# Patient Record
Sex: Female | Born: 1985 | Race: Black or African American | Hispanic: No | Marital: Single | Smoking: Current every day smoker
Health system: Southern US, Community
[De-identification: ages and names within clinical notes are randomized; demographics above are authoritative.]

## PROBLEM LIST (undated history)

## (undated) DIAGNOSIS — F410 Panic disorder [episodic paroxysmal anxiety] without agoraphobia: Secondary | ICD-10-CM

## (undated) DIAGNOSIS — D573 Sickle-cell trait: Secondary | ICD-10-CM

## (undated) DIAGNOSIS — F32A Depression, unspecified: Secondary | ICD-10-CM

## (undated) DIAGNOSIS — F329 Major depressive disorder, single episode, unspecified: Secondary | ICD-10-CM

---

## 2013-12-28 ENCOUNTER — Emergency Department (HOSPITAL_COMMUNITY)
Admission: EM | Admit: 2013-12-28 | Discharge: 2013-12-28 | Disposition: A | Discharge status: Home / Self Care | Payer: Self-pay | Attending: Emergency Medicine | Admitting: Emergency Medicine

## 2013-12-28 ENCOUNTER — Encounter (HOSPITAL_COMMUNITY): Payer: Self-pay | Admitting: Emergency Medicine

## 2013-12-28 DIAGNOSIS — F17200 Nicotine dependence, unspecified, uncomplicated: Secondary | ICD-10-CM | POA: Insufficient documentation

## 2013-12-28 DIAGNOSIS — F3289 Other specified depressive episodes: Secondary | ICD-10-CM | POA: Insufficient documentation

## 2013-12-28 DIAGNOSIS — R109 Unspecified abdominal pain: Secondary | ICD-10-CM | POA: Insufficient documentation

## 2013-12-28 DIAGNOSIS — I498 Other specified cardiac arrhythmias: Secondary | ICD-10-CM | POA: Insufficient documentation

## 2013-12-28 DIAGNOSIS — M2550 Pain in unspecified joint: Secondary | ICD-10-CM | POA: Insufficient documentation

## 2013-12-28 DIAGNOSIS — Z79899 Other long term (current) drug therapy: Secondary | ICD-10-CM | POA: Insufficient documentation

## 2013-12-28 DIAGNOSIS — F410 Panic disorder [episodic paroxysmal anxiety] without agoraphobia: Secondary | ICD-10-CM | POA: Insufficient documentation

## 2013-12-28 DIAGNOSIS — R112 Nausea with vomiting, unspecified: Secondary | ICD-10-CM | POA: Insufficient documentation

## 2013-12-28 DIAGNOSIS — R1110 Vomiting, unspecified: Secondary | ICD-10-CM

## 2013-12-28 DIAGNOSIS — R51 Headache: Secondary | ICD-10-CM | POA: Insufficient documentation

## 2013-12-28 DIAGNOSIS — R52 Pain, unspecified: Secondary | ICD-10-CM | POA: Insufficient documentation

## 2013-12-28 DIAGNOSIS — Z3202 Encounter for pregnancy test, result negative: Secondary | ICD-10-CM | POA: Insufficient documentation

## 2013-12-28 DIAGNOSIS — Z862 Personal history of diseases of the blood and blood-forming organs and certain disorders involving the immune mechanism: Secondary | ICD-10-CM | POA: Insufficient documentation

## 2013-12-28 DIAGNOSIS — F329 Major depressive disorder, single episode, unspecified: Secondary | ICD-10-CM | POA: Insufficient documentation

## 2013-12-28 DIAGNOSIS — R519 Headache, unspecified: Secondary | ICD-10-CM

## 2013-12-28 HISTORY — DX: Major depressive disorder, single episode, unspecified: F32.9

## 2013-12-28 HISTORY — DX: Depression, unspecified: F32.A

## 2013-12-28 HISTORY — DX: Sickle-cell trait: D57.3

## 2013-12-28 HISTORY — DX: Panic disorder (episodic paroxysmal anxiety): F41.0

## 2013-12-28 LAB — CBC WITH DIFFERENTIAL/PLATELET
Basophils Absolute: 0 10*3/uL (ref 0.0–0.1)
Basophils Relative: 0 % (ref 0–1)
EOS ABS: 0.1 10*3/uL (ref 0.0–0.7)
EOS PCT: 1 % (ref 0–5)
HCT: 36 % (ref 36.0–46.0)
HEMOGLOBIN: 13.1 g/dL (ref 12.0–15.0)
LYMPHS PCT: 24 % (ref 12–46)
Lymphs Abs: 2.2 10*3/uL (ref 0.7–4.0)
MCH: 29.2 pg (ref 26.0–34.0)
MCHC: 36.4 g/dL — ABNORMAL HIGH (ref 30.0–36.0)
MCV: 80.2 fL (ref 78.0–100.0)
MONOS PCT: 9 % (ref 3–12)
Monocytes Absolute: 0.9 10*3/uL (ref 0.1–1.0)
Neutro Abs: 6.1 10*3/uL (ref 1.7–7.7)
Neutrophils Relative %: 66 % (ref 43–77)
Platelets: 216 10*3/uL (ref 150–400)
RBC: 4.49 MIL/uL (ref 3.87–5.11)
RDW: 13.7 % (ref 11.5–15.5)
WBC: 9.2 10*3/uL (ref 4.0–10.5)

## 2013-12-28 LAB — COMPREHENSIVE METABOLIC PANEL
ALK PHOS: 61 U/L (ref 39–117)
ALT: 7 U/L (ref 0–35)
AST: 14 U/L (ref 0–37)
Albumin: 4 g/dL (ref 3.5–5.2)
BUN: 11 mg/dL (ref 6–23)
CALCIUM: 9.4 mg/dL (ref 8.4–10.5)
CO2: 25 mEq/L (ref 19–32)
Chloride: 103 mEq/L (ref 96–112)
Creatinine, Ser: 0.74 mg/dL (ref 0.50–1.10)
GFR calc non Af Amer: >90 mL/min (ref >90)
Glucose, Bld: 95 mg/dL (ref 70–99)
POTASSIUM: 4 meq/L (ref 3.7–5.3)
Sodium: 141 mEq/L (ref 137–147)
TOTAL PROTEIN: 7.2 g/dL (ref 6.0–8.3)
Total Bilirubin: 0.5 mg/dL (ref 0.3–1.2)

## 2013-12-28 LAB — URINALYSIS, ROUTINE W REFLEX MICROSCOPIC
Glucose, UA: NEGATIVE mg/dL
HGB URINE DIPSTICK: NEGATIVE
Ketones, ur: 15 mg/dL — AB
Leukocytes, UA: NEGATIVE
NITRITE: NEGATIVE
PROTEIN: NEGATIVE mg/dL
SPECIFIC GRAVITY, URINE: 1.029 (ref 1.005–1.030)
Urobilinogen, UA: 1 mg/dL (ref 0.0–1.0)
pH: 6.5 (ref 5.0–8.0)

## 2013-12-28 LAB — POC URINE PREG, ED: Preg Test, Ur: NEGATIVE

## 2013-12-28 MED ORDER — DIPHENHYDRAMINE HCL 25 MG PO CAPS: 25.0000 mg | ORAL_CAPSULE | Freq: Once | ORAL | Status: DC

## 2013-12-28 MED ORDER — DEXAMETHASONE SODIUM PHOSPHATE 10 MG/ML IJ SOLN: 10.0000 mg | Freq: Once | INTRAMUSCULAR | Status: DC

## 2013-12-28 MED ORDER — METOCLOPRAMIDE HCL 5 MG/ML IJ SOLN: 10.0000 mg | Freq: Once | INTRAMUSCULAR | Status: DC

## 2013-12-28 MED ORDER — IBUPROFEN 600 MG PO TABS: 600.0000 mg | ORAL_TABLET | Freq: Four times a day (QID) | ORAL | Status: DC | PRN

## 2013-12-28 MED ORDER — KETOROLAC TROMETHAMINE 30 MG/ML IJ SOLN
30.0000 mg | Freq: Once | INTRAMUSCULAR | Status: AC
  Administered 2013-12-28: 30 mg via INTRAVENOUS
  Filled 2013-12-28: qty 1

## 2013-12-28 MED ORDER — ONDANSETRON 4 MG PO TBDP
4.0000 mg | ORAL_TABLET | Freq: Once | ORAL | Status: AC
  Administered 2013-12-28: 4 mg via ORAL
  Filled 2013-12-28: qty 1

## 2013-12-28 MED ORDER — IBUPROFEN 600 MG PO TABS: 600.0000 mg | ORAL_TABLET | Freq: Three times a day (TID) | ORAL | Status: DC | PRN

## 2013-12-28 MED ORDER — SODIUM CHLORIDE 0.9 % IV BOLUS (SEPSIS)
1000.0000 mL | Freq: Once | INTRAVENOUS | Status: AC
  Administered 2013-12-28: 1000 mL via INTRAVENOUS

## 2013-12-28 MED ORDER — DEXAMETHASONE SODIUM PHOSPHATE 10 MG/ML IJ SOLN
10.0000 mg | Freq: Once | INTRAMUSCULAR | Status: AC
  Administered 2013-12-28: 10 mg via INTRAVENOUS
  Filled 2013-12-28: qty 1

## 2013-12-28 NOTE — Discharge Instructions (Signed)
Call for a follow up appointment with a Family or Primary Care Provider.  °Return to the Emergency Department if Symptoms worsen.   °Take medication as prescribed.  ° ° °Emergency Department Resource Guide °1) Find a Doctor and Pay Out of Pocket °Although you won't have to find out who is covered by your insurance plan, it is a good idea to ask around and get recommendations. You will then need to call the office and see if the doctor you have chosen will accept you as a new patient and what types of options they offer for patients who are self-pay. Some doctors offer discounts or will set up payment plans for their patients who do not have insurance, but you will need to ask so you aren't surprised when you get to your appointment. ° °2) Contact Your Local Health Department °Not all health departments have doctors that can see patients for sick visits, but many do, so it is worth a call to see if yours does. If you don't know where your local health department is, you can check in your phone book. The CDC also has a tool to help you locate your state's health department, and many state websites also have listings of all of their local health departments. ° °3) Find a Walk-in Clinic °If your illness is not likely to be very severe or complicated, you may want to try a walk in clinic. These are popping up all over the country in pharmacies, drugstores, and shopping centers. They're usually staffed by nurse practitioners or physician assistants that have been trained to treat common illnesses and complaints. They're usually fairly quick and inexpensive. However, if you have serious medical issues or chronic medical problems, these are probably not your best option. ° °No Primary Care Doctor: °- Call [facility] at  832-8000 - they can help you locate a primary care doctor that  accepts your insurance, provides certain services, etc. °- Physician Referral Service- 1-800-533-3463 ° °Chronic Pain  Problems: °Organization         Address  Phone   Notes  °[facility]  (336) 297-2271 Patients need to be referred by their primary care doctor.  ° °Medication Assistance: °Organization         Address  Phone   Notes  °[place] [facility] [address] °[place], [place] [address] (336) 641-8030 --Must be a resident of [place] °-- Must have NO insurance coverage whatsoever (no Medicaid/ Medicare, etc.) °-- The pt. MUST have a primary care doctor that directs their care regularly and follows them in the community °  °[facility]  (866) 331-1348   °[employer]  (888) 892-1162   ° °Agencies that provide inexpensive medical care: °Organization         Address  Phone   Notes  °[facility]  (336) 832-8035   ° Internal Medicine    (336) 832-7272   °[facility] [address] °[place], [place] [address] (336) 832-4777   °[facility] [address], °[place] (336) 271-4999   °[facility]    (336) 373-0678   °[facility]    (336) 272-1050   °[facility] ° [address], [place] Phone:  (336) 832-4444, Fax:  (336) 832-4440 Hours of Operation:  9 am - 6 pm, M-F.  Also accepts Medicaid/Medicare and self-pay.  °[facility] ° [address], [place] Phone: (336) 832-3150, Fax: (336) 832-3151. Hours of Operation:  8:30 am - 5:30 pm, M-F.  Also accepts Medicaid and self-pay.  °[facility] [place] [address], [place] Phone: (336) 878-6027   °[facility] [address], [place], [place] (336)723-1848, Ext. 123 Mondays & Thursdays: 7-9 AM.  First 15 patients are seen on a first come, first serve basis. °  ° °Medicaid-accepting [place] Providers: ° °Organization         Address  Phone   Notes  °[facility] [address], [place] (336) 641-2100 Also  accepts self-pay patients.  °[facility] [address], [place] ° (336) 856-9996   °[facility] [address], [place] (336) 288-8857   °[facility] [address] [place] [address], [place] (336) 299-7000   °Veita Bland [address], [place]  ° (336) 373-1557 Only accepts [place] Access Medicaid patients after they have their name applied to their card.  ° °Self-Pay (no insurance) in [place]: ° °Organization         Address  Phone   Notes  °Sickle Cell Patients, [facility] [address], [place] (336) 832-1970   °[facility] [address], [place] (336) 832-4400   °[facility] ° [address], [place] (336) 992-4800   °[facility]/Dr. Osei-Bonsu ° [address] [place] [address], [place] or [address], [place] (336) 841-8500 Phone number for both [place] and [place] locations is the same.  °[facility] [address], [place] (336) 299-0000   °[facility] [address] [place] [address], [place] or [address] (336) 852-7530 °(336) 878-2260   °[facility] [address], [place] (336) 350-1642, phone; (336) 294-5005, fax Sees patients 1st and 3rd Saturday of every month.  Must not qualify for public or private insurance (i.e. Medicaid, Medicare, [place] Health Choice, Veterans' Benefits) • Household income should be no more than 200% of the poverty level •The clinic cannot treat you if you are pregnant or think you are pregnant • Sexually transmitted diseases are not treated at the clinic.  ° ° °Dental Care: °Organization         Address  Phone  Notes  °[facility] [facility] [address], [place] (336) 641-6152 Accepts children up to age 21 who are enrolled in Medicaid or [place] Health Choice; pregnant  women with a Medicaid card; and children who have applied for Medicaid or [place] Health Choice, but were declined, whose parents can pay a reduced fee at time of service.  °[facility] [place]  [address], [place] (336) 641-7733 Accepts children up to age 21 who are enrolled in Medicaid or [place] Health Choice; pregnant women with a Medicaid card; and children who have applied for Medicaid or  Health Choice, but were declined, whose parents can pay a reduced fee at time of service.  °[facility] ° [address], [place] (336) 641-4533 Patients are seen by appointment only. Walk-ins are not accepted. [facility] will see patients 18 years of age and older. °Monday - Tuesday (8am-5pm) °Most Wednesdays (8:30-5pm) °$30 per visit, cash only  °[facility] °   [address], [place] (336) 641-4533 Patients are seen by appointment only. Walk-ins are not accepted. [facility] will see patients 18 years of age and older. °One Wednesday Evening (Monthly: Volunteer Based).  $30 per visit, cash only  °[facility]  (919) 537-3737 for adults; Children under age 4, call Graduate Pediatric Dentistry at (919) [phone]. Children aged 4-14, please call (919) [phone] to request a pediatric application. ° Dental services are provided in all areas of dental care including fillings, crowns and bridges, complete and partial dentures, implants, gum treatment, root canals, and extractions. Preventive care is also provided. Treatment is provided to both adults and children. °Patients are selected via a lottery and there is often a waiting list. °  °[facility] [address], °[place] ° (336) 763-8833 [email or web address] °  °[facility] [address], [place], [place] (336)723-1848, Ext. 123 Second and Fourth Thursday of each month, opens at 6:30 AM; Clinic ends at 9 AM.  Patients are  seen on a first-come first-served basis, and a limited number are seen during each clinic.  ° °[facility] ° [address], [place], [place] (336) 723-7904   Eligibility Requirements °You must have lived in [place], [place], or [place] counties for at least the last three months. °  You cannot be eligible for state or federal sponsored healthcare insurance, including Veterans Administration, Medicaid, or Medicare. °  You generally cannot be eligible for healthcare insurance through your employer.  °  How to apply: °Eligibility screenings are held every Tuesday and Wednesday afternoon from 1:00 pm until 4:00 pm. You do not need an appointment for the interview!  °[facility] [address], [place], [place] 336-631-2330   °[facility]  336-342-8273   °[facility]  336-703-3100   °[facility]  336-570-6415   ° °Behavioral Health Resources in the Community: °Intensive Outpatient Programs °Organization         Address  Phone  Notes  °[facility] [address], [place], [place] 336-878-6098   °[facility] [address], [place], [place] 336-832-9800   °[facility] [address], [place], [place] ° 336-882-2125   °[facility] [address],  °[place], [place] 1-800-853-5163 or 336-641-4981   °Substance Abuse Resources °Organization         Address  Phone  Notes  °[facility]  336-882-2125   °[facility]  336-784-9470   °[facility]  336-285-9073   °[facility]  336-845-3988   °Residential & Outpatient Substance Abuse Program  1-800-659-3381   °Psychological Services °Organization         Address  Phone  Notes  °[facility]  336- 832-9600   °[facility]  336- 378-7881   °[facility] [address], [place] 1-800-853-5163 or 336-641-4981   ° °Mobile Crisis  Teams °Organization         Address  Phone  Notes  °[facility], Mobile Crisis Care Unit  1-877-626-1772   °Assertive °[facility] ° [address] [place], [place] 336-834-9664   °Sharon DeEsch [address] °[place] 336-554-5454   ° °Self-Help/Support Groups °Organization         Address  Phone             Notes  °[facility]  -   variety of support groups  336- 373-1402 Call for more information  °Narcotics Anonymous (NA), [facility] [address], °[place]  2 meetings at this location  ° °Residential Treatment Programs °Organization         Address  Phone  Notes  °[facility] [address],    °[place]  1-866-801-8205   °[facility] ° [address], [place], [place] 704-293-8524   °[facility] [address], [place] 336-845-3988 Admissions: 8am-3pm M-F  °[facility] [address]    °[place], [place] 336-841-1104   °[facility] [address], [place], [place] 336-379-7146   °[facility] [address]  °[place], [place] 336-285-9073   °[facility] Intensive Outpatient [address], [place], [place] 336-852-3033   °[facility] [address]  °[place], [place] 1-877-615-2722 or 336-784-9470   °[facility] [address] [place], [place] 336-227-7417 Accepts Medicaid  °[facility] [address]  °[place] 1-800-659-3381 Substance Abuse/Addiction Treatment  ° °[place] Behavioral Health Resources °Organization         Address  Phone  Notes  °[facility]  (888) 581-9988   °Julie Brannon, PhD [address] [place], [place]   (336) 349-5553 or (336) 951-0000   °[facility]   [address] °[place], [place] (336) 349-4454   °[facility] [address], [place], [place] (336) 342-8316  Insurance/Medicaid/sponsorship through [facility]  °[facility] [address]                                    [place], [place] (336) 342-8316 Therapy/tele-psych/case  °[facility] [address]  ° [place],  (336) 349-2233    °Dr. Arfeen  (336) 349-4544   °[facility] [place]  [employer] [place] [facility] 1) [address], [place] °2) [address], [place] °3)  [address], [place] (336) 349-3220 °(336) 342-7768 ° °(336) 342-8140   °[place] [facility] (336) 342-1394 or (336) 342-3537 (After Hours)    ° ° ° ° °

## 2013-12-28 NOTE — ED Provider Notes (Signed)
CSN: [id number]     Arrival date & time 12/28/13  1053 History   First MD Initiated Contact with Patient 12/28/13 1105     Chief Complaint  Patient presents with  . Abdominal Pain     (Consider location/radiation/quality/duration/timing/severity/associated sxs/prior Treatment) HPI Comments: Julia Foley is a 28 y.o. female with a past medical history of Sickle cell trait, depression, and anxiety, presenting the Emergency Department with a chief complaint of arthalgias, vomiting since yesterday.  The patient reports lower extremity and upper extremity joint discomfort.  She also complains of a generalized headache, similar to previous headaches in the past.  She reports gradual onset. No PCP [facility].   The history is provided by the patient. No language interpreter was used.    Past Medical History  Diagnosis Date  . Sickle cell trait   . Depression   . Panic attack    Past Surgical History  Procedure Laterality Date  . Cesarean section     No family history on file. History  Substance Use Topics  . Smoking status: Current Every Day Smoker  . Smokeless tobacco: Not on file  . Alcohol Use: Yes   OB History   Grav Para Term Preterm Abortions TAB SAB Ect Mult Living                 Review of Systems  Constitutional: Negative for fever and chills.  Cardiovascular: Negative for chest pain and leg swelling.  Gastrointestinal: Positive for nausea, vomiting and abdominal pain. Negative for diarrhea, constipation and blood in stool.  Genitourinary: Negative for dysuria, urgency, frequency, vaginal bleeding, vaginal discharge and genital sores.  Neurological: Positive for headaches.      Allergies  Review of patient's allergies indicates no known allergies.  Home Medications   Current Outpatient Rx  Name  Route  Sig  Dispense  Refill  . sertraline (ZOLOFT) 50 MG tablet   Oral   Take 50 mg by mouth daily.         . traZODone (DESYREL) 100 MG  tablet   Oral   Take 100 mg by mouth at bedtime.          BP 109/62  Pulse 63  Temp(Src) 99.2 F (37.3 C) (Oral)  Resp 18  SpO2 97% Physical Exam  Nursing note and vitals reviewed. Constitutional: She is oriented to person, place, and time. She appears well-developed and well-nourished. No distress.  HENT:  Head: Normocephalic and atraumatic.  Eyes: EOM are normal. Pupils are equal, round, and reactive to light. No scleral icterus.  Neck: Neck supple.  Cardiovascular: Regular rhythm and normal heart sounds.  Bradycardia present.   No murmur heard. Pulmonary/Chest: Effort normal and breath sounds normal. No respiratory distress. She has no wheezes. She has no rales.  Abdominal: Soft. Bowel sounds are normal. There is no tenderness. There is no rigidity, no rebound, no guarding, no CVA tenderness, no tenderness at McBurney's point and negative Murphy's sign.  Abdominal discomfort unreproduceable with palpation.  Musculoskeletal: Normal range of motion. She exhibits no edema.  Neurological: She is alert and oriented to person, place, and time.  Speech is clear and goal oriented, follows commands Cranial nerves III - XII grossly intact, no facial droop Normal strength in upper and lower extremities bilaterally Sensation normal to light and sharp touch Moves all 4 extremities without ataxia  Skin: Skin is warm and dry. No rash noted.  Psychiatric: Her behavior is normal. Thought content normal. Her mood appears  anxious.    ED Course  Procedures (including critical care time) Labs Review Labs Reviewed  CBC WITH DIFFERENTIAL - Abnormal; Notable for the following:    MCHC 36.4 (*)    All other components within normal limits  URINALYSIS, ROUTINE W REFLEX MICROSCOPIC - Abnormal; Notable for the following:    Color, Urine AMBER (*)    APPearance CLOUDY (*)    Bilirubin Urine SMALL (*)    Ketones, ur 15 (*)    All other components within normal limits  COMPREHENSIVE METABOLIC  PANEL  POC URINE PREG, ED  POC URINE PREG, ED   Imaging Review No results found.   EKG Interpretation None      MDM   Final diagnoses:  Headache  Abdominal discomfort  Body aches  Vomiting   Pt with multiple complaints, vomiting resolved, complains of headache and body aches.  Labs orderd, medication ordered.  Negative pregnancy, Urine without infection. No other concerning lab abnormalities. Re-eval: Patient reports abdominal pain has resolved, no emesis. Complains of headache.  Medicaiton given. Re-eval Pt reports headache improvement. Discussed lab results and treatment plan with the patient. Discussed needing to establish care locally. Return precautions given. Reports understanding and no other concerns at this time.  Patient is stable for discharge at this time.    Meds given in ED:  Medications  ondansetron (ZOFRAN-ODT) disintegrating tablet 4 mg (4 mg Oral Given 12/28/13 1155)  sodium chloride 0.9 % bolus 1,000 mL (0 mLs Intravenous Stopped 12/28/13 1403)  ketorolac (TORADOL) 30 MG/ML injection 30 mg (30 mg Intravenous Given 12/28/13 1155)  dexamethasone (DECADRON) injection 10 mg (10 mg Intravenous Given 12/28/13 1350)    Discharge Medication List as of 12/28/2013  2:19 PM    START taking these medications   Details  ibuprofen (ADVIL,MOTRIN) 600 MG tablet Take 1 tablet (600 mg total) by mouth every 8 (eight) hours as needed. Take with meals, Starting 12/28/2013, Until Discontinued, Print            Clabe SealLauren M [place] Holquin, PA-C 12/29/13 1423

## 2013-12-28 NOTE — ED Notes (Addendum)
Started to have pain after panic attack yesterday states only has sickle cell trait  Has abd pain her head hurts  And  Both legs hurt  Has had some nausea and  Denies dysuria

## 2013-12-31 NOTE — ED Provider Notes (Signed)
Medical screening examination/treatment/procedure(s) were performed by non-physician practitioner and as supervising physician I was immediately available for consultation/collaboration.   EKG Interpretation None        Dagmar HaitWilliam Chereese [place], MD 12/31/13 1520

## 2014-05-11 ENCOUNTER — Emergency Department (HOSPITAL_COMMUNITY): Payer: No Typology Code available for payment source

## 2014-05-11 ENCOUNTER — Emergency Department (HOSPITAL_COMMUNITY)
Admission: EM | Admit: 2014-05-11 | Discharge: 2014-05-11 | Disposition: A | Discharge status: Home / Self Care | Payer: Self-pay | Attending: Emergency Medicine | Admitting: Emergency Medicine

## 2014-05-11 ENCOUNTER — Encounter (HOSPITAL_COMMUNITY): Payer: Self-pay | Admitting: Emergency Medicine

## 2014-05-11 ENCOUNTER — Emergency Department (HOSPITAL_COMMUNITY): Payer: Self-pay

## 2014-05-11 DIAGNOSIS — Z862 Personal history of diseases of the blood and blood-forming organs and certain disorders involving the immune mechanism: Secondary | ICD-10-CM | POA: Insufficient documentation

## 2014-05-11 DIAGNOSIS — F329 Major depressive disorder, single episode, unspecified: Secondary | ICD-10-CM | POA: Insufficient documentation

## 2014-05-11 DIAGNOSIS — Z3202 Encounter for pregnancy test, result negative: Secondary | ICD-10-CM | POA: Insufficient documentation

## 2014-05-11 DIAGNOSIS — M79605 Pain in left leg: Secondary | ICD-10-CM

## 2014-05-11 DIAGNOSIS — S8990XA Unspecified injury of unspecified lower leg, initial encounter: Secondary | ICD-10-CM | POA: Insufficient documentation

## 2014-05-11 DIAGNOSIS — Y9389 Activity, other specified: Secondary | ICD-10-CM | POA: Insufficient documentation

## 2014-05-11 DIAGNOSIS — IMO0002 Reserved for concepts with insufficient information to code with codable children: Secondary | ICD-10-CM | POA: Insufficient documentation

## 2014-05-11 DIAGNOSIS — F410 Panic disorder [episodic paroxysmal anxiety] without agoraphobia: Secondary | ICD-10-CM | POA: Insufficient documentation

## 2014-05-11 DIAGNOSIS — F3289 Other specified depressive episodes: Secondary | ICD-10-CM | POA: Insufficient documentation

## 2014-05-11 DIAGNOSIS — S99919A Unspecified injury of unspecified ankle, initial encounter: Principal | ICD-10-CM

## 2014-05-11 DIAGNOSIS — R209 Unspecified disturbances of skin sensation: Secondary | ICD-10-CM | POA: Insufficient documentation

## 2014-05-11 DIAGNOSIS — S99929A Unspecified injury of unspecified foot, initial encounter: Principal | ICD-10-CM

## 2014-05-11 DIAGNOSIS — F17200 Nicotine dependence, unspecified, uncomplicated: Secondary | ICD-10-CM | POA: Insufficient documentation

## 2014-05-11 DIAGNOSIS — Z79899 Other long term (current) drug therapy: Secondary | ICD-10-CM | POA: Insufficient documentation

## 2014-05-11 DIAGNOSIS — Y92410 Unspecified street and highway as the place of occurrence of the external cause: Secondary | ICD-10-CM | POA: Insufficient documentation

## 2014-05-11 LAB — CBC
HCT: 35.9 % — ABNORMAL LOW (ref 36.0–46.0)
Hemoglobin: 12.8 g/dL (ref 12.0–15.0)
MCH: 28.9 pg (ref 26.0–34.0)
MCHC: 35.7 g/dL (ref 30.0–36.0)
MCV: 81 fL (ref 78.0–100.0)
PLATELETS: 230 10*3/uL (ref 150–400)
RBC: 4.43 MIL/uL (ref 3.87–5.11)
RDW: 13.6 % (ref 11.5–15.5)
WBC: 11.4 10*3/uL — ABNORMAL HIGH (ref 4.0–10.5)

## 2014-05-11 LAB — I-STAT CHEM 8, ED
BUN: 10 mg/dL (ref 6–23)
CALCIUM ION: 1.19 mmol/L (ref 1.12–1.23)
Chloride: 105 mEq/L (ref 96–112)
Creatinine, Ser: 0.8 mg/dL (ref 0.50–1.10)
Glucose, Bld: 97 mg/dL (ref 70–99)
HEMATOCRIT: 41 % (ref 36.0–46.0)
HEMOGLOBIN: 13.9 g/dL (ref 12.0–15.0)
Potassium: 4 mEq/L (ref 3.7–5.3)
SODIUM: 140 meq/L (ref 137–147)
TCO2: 26 mmol/L (ref 0–100)

## 2014-05-11 LAB — POC URINE PREG, ED: Preg Test, Ur: NEGATIVE

## 2014-05-11 MED ORDER — CYCLOBENZAPRINE HCL 10 MG PO TABS: 10.0000 mg | ORAL_TABLET | Freq: Two times a day (BID) | ORAL | Status: DC | PRN

## 2014-05-11 MED ORDER — MORPHINE SULFATE 4 MG/ML IJ SOLN
4.0000 mg | Freq: Once | INTRAMUSCULAR | Status: AC
  Administered 2014-05-11: 4 mg via INTRAVENOUS
  Filled 2014-05-11: qty 1

## 2014-05-11 MED ORDER — FENTANYL CITRATE 0.05 MG/ML IJ SOLN
INTRAMUSCULAR | Status: AC
  Filled 2014-05-11: qty 2

## 2014-05-11 MED ORDER — KETOROLAC TROMETHAMINE 60 MG/2ML IM SOLN
60.0000 mg | Freq: Once | INTRAMUSCULAR | Status: AC
  Administered 2014-05-11: 60 mg via INTRAMUSCULAR
  Filled 2014-05-11: qty 2

## 2014-05-11 MED ORDER — IBUPROFEN 600 MG PO TABS: 600.0000 mg | ORAL_TABLET | Freq: Four times a day (QID) | ORAL | Status: DC | PRN

## 2014-05-11 MED ORDER — FENTANYL CITRATE 0.05 MG/ML IJ SOLN
INTRAMUSCULAR | Status: AC | PRN
  Administered 2014-05-11: 50 ug via INTRAVENOUS

## 2014-05-11 NOTE — ED Notes (Signed)
Radiology at bedside

## 2014-05-11 NOTE — Progress Notes (Signed)
Chaplain met with [name] while alert and responsive. Chaplain asked if there was anyone she'd like [place] to notify of her whereabouts, she declined, stating there's no one she'd like [place] to contact at this time.  Julia Foley

## 2014-05-11 NOTE — ED Notes (Signed)
Chaplain at bedside
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
## 2014-05-11 NOTE — ED Notes (Signed)
Ortho tech at bedside 

## 2014-05-11 NOTE — ED Notes (Signed)
Vital signs stable. 
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
## 2014-05-11 NOTE — ED Notes (Signed)
Phlebotomy at bedside.
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
## 2014-05-11 NOTE — Discharge Instructions (Signed)
Muscle Cramps and Spasms Muscle cramps and spasms are when muscles tighten by themselves. They usually get better within minutes. Muscle cramps are painful. They are usually stronger and last longer than muscle spasms. Muscle spasms may or may not be painful. They can last a few seconds or much longer. HOME CARE  Drink enough fluid to keep your pee (urine) clear or pale yellow.  Massage, stretch, and relax the muscle.  Use a warm towel, heating pad, or warm shower water on tight muscles.  Place ice on the muscle if it is tender or in pain.  Put ice in a plastic bag.  Place a towel between your skin and the bag.  Leave the ice on for 15-20 minutes, 03-04 times a day.  Only take medicine as told by your doctor. GET HELP RIGHT AWAY IF:  Your cramps or spasms get worse, happen more often, or do not get better with time. MAKE SURE YOU:  Understand these instructions.  Will watch your condition.  Will get help right away if you are not doing well or get worse. Document Released: 08/26/2008 Document Revised: 01/08/2013 Document Reviewed: 08/30/2012 ExitCare Patient Information 2015 [employer]. This information is not intended to replace advice given to you by your health care provider. Make sure you discuss any questions you have with your health care provider.  
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
## 2015-04-07 ENCOUNTER — Encounter (HOSPITAL_COMMUNITY): Payer: Self-pay

## 2015-04-07 ENCOUNTER — Emergency Department (HOSPITAL_COMMUNITY): Payer: Self-pay

## 2015-04-07 ENCOUNTER — Emergency Department (HOSPITAL_COMMUNITY)
Admission: EM | Admit: 2015-04-07 | Discharge: 2015-04-07 | Disposition: A | Discharge status: Home / Self Care | Payer: Self-pay | Attending: Emergency Medicine | Admitting: Emergency Medicine

## 2015-04-07 DIAGNOSIS — Z862 Personal history of diseases of the blood and blood-forming organs and certain disorders involving the immune mechanism: Secondary | ICD-10-CM | POA: Insufficient documentation

## 2015-04-07 DIAGNOSIS — S60416A Abrasion of right little finger, initial encounter: Secondary | ICD-10-CM | POA: Insufficient documentation

## 2015-04-07 DIAGNOSIS — Y9289 Other specified places as the place of occurrence of the external cause: Secondary | ICD-10-CM | POA: Insufficient documentation

## 2015-04-07 DIAGNOSIS — S62308A Unspecified fracture of other metacarpal bone, initial encounter for closed fracture: Secondary | ICD-10-CM

## 2015-04-07 DIAGNOSIS — Z79899 Other long term (current) drug therapy: Secondary | ICD-10-CM | POA: Insufficient documentation

## 2015-04-07 DIAGNOSIS — S60414A Abrasion of right ring finger, initial encounter: Secondary | ICD-10-CM | POA: Insufficient documentation

## 2015-04-07 DIAGNOSIS — Y9389 Activity, other specified: Secondary | ICD-10-CM | POA: Insufficient documentation

## 2015-04-07 DIAGNOSIS — F329 Major depressive disorder, single episode, unspecified: Secondary | ICD-10-CM | POA: Insufficient documentation

## 2015-04-07 DIAGNOSIS — W228XXA Striking against or struck by other objects, initial encounter: Secondary | ICD-10-CM | POA: Insufficient documentation

## 2015-04-07 DIAGNOSIS — S62316A Displaced fracture of base of fifth metacarpal bone, right hand, initial encounter for closed fracture: Secondary | ICD-10-CM | POA: Insufficient documentation

## 2015-04-07 DIAGNOSIS — F410 Panic disorder [episodic paroxysmal anxiety] without agoraphobia: Secondary | ICD-10-CM | POA: Insufficient documentation

## 2015-04-07 DIAGNOSIS — Z720 Tobacco use: Secondary | ICD-10-CM | POA: Insufficient documentation

## 2015-04-07 DIAGNOSIS — Y998 Other external cause status: Secondary | ICD-10-CM | POA: Insufficient documentation

## 2015-04-07 MED ORDER — HYDROCODONE-ACETAMINOPHEN 5-325 MG PO TABS: 2.0000 | ORAL_TABLET | ORAL | Status: DC | PRN

## 2015-04-07 MED ORDER — HYDROCODONE-ACETAMINOPHEN 5-325 MG PO TABS
2.0000 | ORAL_TABLET | Freq: Once | ORAL | Status: AC
  Administered 2015-04-07: 2 via ORAL
  Filled 2015-04-07: qty 2
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
## 2015-04-07 NOTE — ED Notes (Signed)
Patient transported to X-ray 

## 2015-04-07 NOTE — Discharge Instructions (Signed)
Boxer's Fracture °You have a break (fracture) of the fifth metacarpal bone. This is commonly called a boxer's fracture. This is the bone in the hand where the little finger attaches. The fracture is in the end of that bone, closest to the little finger. It is usually caused when you hit an object with a clenched fist. Often, the knuckle is pushed down by the impact. Sometimes, the fracture rotates out of position. A boxer's fracture will usually heal within 6 weeks, if it is treated properly and protected from re-injury. Surgery is sometimes needed. °A cast, splint, or bulky hand dressing may be used to protect and immobilize a boxer's fracture. Do not remove this device or dressing until your caregiver approves. Keep your hand elevated, and apply ice packs for 15-20 minutes every 2 hours, for the first 2 days. Elevation and ice help reduce swelling and relieve pain. See your caregiver, or an orthopedic specialist, for follow-up care within the next 10 days. This is to make sure your fracture is healing properly. °Document Released: 09/13/2005 Document Revised: 12/06/2011 Document Reviewed: 03/03/2007 °ExitCare® Patient Information ©2015 [employer]. This information is not intended to replace advice given to you by your health care provider. Make sure you discuss any questions you have with your health care provider. ° °

## 2015-04-07 NOTE — Progress Notes (Signed)
Orthopedic Tech Progress Note Patient Details:  Julia ShellingCassia Klemann [birth year] [id number] Applied fiberglass ulnar gutter splint to RUE.  Pulses, sensation, motion intact before and after splinting.  Capillary refill less than 2 seconds before and after splinting. Ortho Devices Type of Ortho Device: Ulna gutter splint Ortho Device/Splint Location: RUE Ortho Device/Splint Interventions: Application   Julia Foley, Julia Foley 04/07/2015, 5:28 PM
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
## 2015-04-14 ENCOUNTER — Ambulatory Visit: Payer: No Typology Code available for payment source | Attending: Orthopedic Surgery | Admitting: Occupational Therapy

## 2015-04-14 ENCOUNTER — Encounter: Payer: Self-pay | Admitting: Occupational Therapy

## 2015-04-14 DIAGNOSIS — M6289 Other specified disorders of muscle: Secondary | ICD-10-CM | POA: Insufficient documentation

## 2015-04-14 DIAGNOSIS — M25641 Stiffness of right hand, not elsewhere classified: Secondary | ICD-10-CM | POA: Insufficient documentation

## 2015-04-14 DIAGNOSIS — M79641 Pain in right hand: Secondary | ICD-10-CM | POA: Insufficient documentation

## 2015-04-14 DIAGNOSIS — R29898 Other symptoms and signs involving the musculoskeletal system: Secondary | ICD-10-CM

## 2015-04-14 NOTE — Therapy (Signed)
[facility] [address] [place], [place], [address] Phone: [phone]   Fax:  [phone]  Occupational Therapy Evaluation  Patient Details  Name: Julia ShellingCassia Foley MRN: [id number] Date of Birth: May 20, 1986 Referring Provider:  Betha LoaKuzma, Kevin, MD  Encounter Date: 04/14/2015      OT End of Session - 04/14/15 0906    Visit Number 1   Date for OT Re-Evaluation 06/15/15   Authorization Type Med pay   OT Start Time 0800   OT Stop Time 0900   OT Time Calculation (min) 60 min   Equipment Utilized During Treatment splint   Activity Tolerance Patient tolerated treatment well      Past Medical History  Diagnosis Date  . Sickle cell trait   . Depression   . Panic attack     Past Surgical History  Procedure Laterality Date  . Cesarean section      There were no vitals filed for this visit.  Visit Diagnosis:  Pain in joint, hand, right - Plan: Ot plan of care cert/re-cert  Stiffness of joint, hand, right - Plan: Ot plan of care cert/re-cert  Weakness of right hand - Plan: Ot plan of care cert/re-cert      Subjective Assessment - 04/14/15 0803    Subjective  This feels a lot better (re: splint)   Patient is accompained by: --  boyfriend   Patient Stated Goals Get my hand better   Currently in Pain? Yes   Pain Score 3    Pain Location Hand   Pain Orientation Right   Pain Descriptors / Indicators Sharp   Pain Type Acute pain   Pain Onset In the past 7 days   Pain Frequency Constant   Aggravating Factors  constant activity   Pain Relieving Factors meds           [facility] OT Assessment - 04/14/15 0859    Assessment   Diagnosis Rt small metacarpal neck fracture (no surgery)   Onset Date 04/06/15  placed in soft cast 04/07/15   Assessment Pt arrived fully wrapped and protected in ulnar gutter soft cast   Prior Therapy none   Precautions   Precautions Other (comment)   Precaution Comments splint on at all  times (except hygiene care) until MD clears   Required Braces or Orthoses Other Brace/Splint   Other Brace/Splint ulnar gutter splint with wrist neutral, MP's flexed and IP's straight (ring and small finger) per MD orders   Balance Screen   Has the patient fallen in the past 6 months No   Has the patient had a decrease in activity level because of a fear of falling?  No   Is the patient reluctant to leave their home because of a fear of falling?  No   Home  Environment   Lives With Alone   Prior Function   Level of Independence Independent   ADL   ADL comments Pt performing all ADLS mod I level using Lt dominant hand   Mobility   Mobility Status Independent   Written Expression   Dominant Hand Left   Handwriting 100% legible   Edema   Edema mild at 5th metacarpal only                  OT Treatments/Exercises (OP) - 04/14/15 0001    Splinting   Splinting Carefully unwrapped and removed soft cast and kept hand immobolized. Fabricated and fitted ulnar gutter splint with wirst neutral, MP's flexed and  IP's extended (of ring and small fingers) per MD orders. Issued splint               OT Education - 04/14/15 0853    Education provided Yes   Education Details SPLINT WEAR AND CARE, PRECAUTIONS   Person(s) Educated Patient   Methods Explanation;Demonstration;Handout   Comprehension Verbalized understanding;Returned demonstration          OT Short Term Goals - 04/14/15 0911    OT SHORT TERM GOAL #1   Title Independent w/ splint wear and care (due 05/15/15)    Baseline issued, may need adjustments   Time 4   Period Weeks   Status On-going   OT SHORT TERM GOAL #2   Title Pain less than or equal to 3/10 with all activities   Baseline 3/10 at rest, higher with activities   Time 4   Period Weeks   Status New           OT Long Term Goals - 04/14/15 0912    OT LONG TERM GOAL #1   Title Issue HEP for ROM ( if warranted by MD) - DUE 06/15/15   Time 8    Period Weeks   Status New               Plan - 04/14/15 0907    Clinical Impression Statement Pt is a 29 y.o. female who presents to outpatient rehab fully wrapped/protected s/p Rt small metacarpal neck fx for splinting purposes. Pt had no surgery. Fabricated and fitted ulnar gutter splint per MD specifications today.   Pt will benefit from skilled therapeutic intervention in order to improve on the following deficits (Retired) IT trainer;Increased edema;Impaired sensation;Decreased knowledge of precautions;Impaired UE functional use;Pain;Decreased strength   Rehab Potential Good   OT Frequency --  3 visits over 8 week duration (for splinting adjustments prn, and issue of HEP if warranted by MD)   OT Treatment/Interventions Self-care/ADL training;Moist Heat;Fluidtherapy;DME and/or AE instruction;Splinting;Patient/family education;Therapeutic exercises;Therapeutic activities;Passive range of motion;Parrafin;Manual Therapy   Plan Splint adjustment prn   OT Home Exercise Plan splint wear and care instructions issued today 04/14/15   Consulted and Agree with Plan of Care Patient        Problem List There are no active problems to display for this patient.   Kelli Churn, OTR/L 04/14/2015, 9:15 AM   [facility] [address] [place], [place], [address] Phone: [phone]   Fax:  [phone]

## 2015-04-14 NOTE — Patient Instructions (Signed)
SPLINT WEAR AND CARE   WEARING SCHEDULE:  Wear splint at ALL times except for hygiene care   PURPOSE:  To prevent movement and for protection until injury can heal  CARE OF SPLINT:  Keep splint away from heat sources including: stove, radiator or furnace, or a car in sunlight. The splint can melt and will no longer fit you properly  Keep away from pets and children  Clean the splint with rubbing alcohol 1-2 times per day.  * During this time, make sure you also clean your hand/arm as instructed by your therapist and/or perform dressing changes as needed. Then dry hand/arm completely before replacing splint. (When cleaning hand/arm, keep it immobilized in same position until splint is replaced)  PRECAUTIONS/POTENTIAL PROBLEMS: *If you notice or experience increased pain, swelling, numbness, or a lingering reddened area from the splint: Contact your therapist immediately by calling [phone]. You must wear the splint for protection, but we will get you scheduled for adjustments as quickly as possible.  (If only straps or hooks need to be replaced and NO adjustments to the splint need to be made, just call the office ahead and let them know you are coming in)  If you have any medical concerns or signs of infection, please call your doctor immediately    

## 2015-04-21 ENCOUNTER — Encounter: Payer: Self-pay | Admitting: Occupational Therapy

## 2015-08-04 ENCOUNTER — Encounter: Payer: Self-pay | Admitting: Occupational Therapy

## 2015-08-04 NOTE — Therapy (Signed)
[facility] [address] [place], [place], [address] Phone: [phone]   Fax:  [phone]  Patient Details  Name: Julia Foley MRN: [id number] Date of Birth: November 03, 1985 Referring Provider:  No ref. provider found  Encounter Date: 08/04/2015  Pt did not return for follow up O.T. and splinting adjustments after initial visit on 04/29/15. Will resolve episode of care at this time   Kelli ChurnBallie, Cameryn Schum Johnson, OTR/L 08/04/2015, 11:37 AM  [facility] [address] [place], [place], [address] Phone: [phone]   Fax:  [phone]

## 2016-03-29 ENCOUNTER — Emergency Department: Payer: Self-pay

## 2016-03-29 ENCOUNTER — Encounter: Payer: Self-pay | Admitting: Emergency Medicine

## 2016-03-29 ENCOUNTER — Emergency Department
Admission: EM | Admit: 2016-03-29 | Discharge: 2016-03-29 | Disposition: A | Discharge status: Home / Self Care | Payer: Self-pay | Attending: Emergency Medicine | Admitting: Emergency Medicine

## 2016-03-29 DIAGNOSIS — F329 Major depressive disorder, single episode, unspecified: Secondary | ICD-10-CM | POA: Insufficient documentation

## 2016-03-29 DIAGNOSIS — Y939 Activity, unspecified: Secondary | ICD-10-CM | POA: Insufficient documentation

## 2016-03-29 DIAGNOSIS — X58XXXA Exposure to other specified factors, initial encounter: Secondary | ICD-10-CM | POA: Insufficient documentation

## 2016-03-29 DIAGNOSIS — Y999 Unspecified external cause status: Secondary | ICD-10-CM | POA: Insufficient documentation

## 2016-03-29 DIAGNOSIS — Y929 Unspecified place or not applicable: Secondary | ICD-10-CM | POA: Insufficient documentation

## 2016-03-29 DIAGNOSIS — S43402A Unspecified sprain of left shoulder joint, initial encounter: Secondary | ICD-10-CM | POA: Insufficient documentation

## 2016-03-29 DIAGNOSIS — F17200 Nicotine dependence, unspecified, uncomplicated: Secondary | ICD-10-CM | POA: Insufficient documentation

## 2016-03-29 MED ORDER — NAPROXEN 500 MG PO TABS
500.0000 mg | ORAL_TABLET | Freq: Once | ORAL | Status: AC
  Administered 2016-03-29: 500 mg via ORAL
  Filled 2016-03-29: qty 1

## 2016-03-29 MED ORDER — NAPROXEN 500 MG PO TABS
ORAL_TABLET | ORAL | Status: AC
  Filled 2016-03-29: qty 1

## 2016-03-29 MED ORDER — NAPROXEN 500 MG PO TABS: 500.0000 mg | ORAL_TABLET | Freq: Two times a day (BID) | ORAL | Status: DC

## 2016-03-29 NOTE — ED Notes (Signed)
Patient presents to the ED with severe left shoulder pain.  Patient having difficulty moving her left arm.  Patient ambulatory to triage.  Patient is obviously uncomfortable.

## 2016-03-29 NOTE — ED Notes (Signed)
AAOx3.  Skin warm and dry.  NAD 

## 2016-03-29 NOTE — ED Notes (Signed)
Pt complains of left shoulder pain, pt denies injury to arm, pt reports pain radiates down arm

## 2016-03-29 NOTE — ED Provider Notes (Signed)
[facility] Emergency Department Provider Note  ____________________________________________  Time seen: On arrival  I have reviewed the triage vital signs and the nursing notes.   HISTORY  Chief Complaint Shoulder Pain    HPI Julia Foley is a 30 y.o. female who presents with complaints of left shoulder pain. She reports is been hurting for over a week. She notes it is especially painful if she attempts to lift the arm. She is able to internally and externally rotate with minimal pain. No trauma to the area. No history of similar.    Past Medical History  Diagnosis Date  . Sickle cell trait (HCC)   . Depression   . Panic attack     There are no active problems to display for this patient.   Past Surgical History  Procedure Laterality Date  . Cesarean section      Current Outpatient Rx  Name  Route  Sig  Dispense  Refill  . atomoxetine (STRATTERA) 25 MG capsule   Oral   Take 75 mg by mouth daily.         . cyclobenzaprine (FLEXERIL) 10 MG tablet   Oral   Take 1 tablet (10 mg total) by mouth 2 (two) times daily as needed for muscle spasms. Patient not taking: Reported on 04/14/2015   10 tablet   0   . HYDROcodone-acetaminophen (NORCO/VICODIN) 5-325 MG per tablet   Oral   Take 2 tablets by mouth every 4 (four) hours as needed.   16 tablet   0   . ibuprofen (ADVIL,MOTRIN) 600 MG tablet   Oral   Take 1 tablet (600 mg total) by mouth every 6 (six) hours as needed for moderate pain. Patient not taking: Reported on 04/14/2015   30 tablet   0   . naproxen (NAPROSYN) 500 MG tablet   Oral   Take 1 tablet (500 mg total) by mouth 2 (two) times daily with a meal.   20 tablet   2   . sertraline (ZOLOFT) 50 MG tablet   Oral   Take 50 mg by mouth daily.         . traZODone (DESYREL) 50 MG tablet   Oral   Take 50 mg by mouth at bedtime.           Allergies Review of patient's allergies indicates no known allergies.  No family  history on file.  Social History Social History  Substance Use Topics  . Smoking status: Current Every Day Smoker  . Smokeless tobacco: None  . Alcohol Use: Yes    Review of Systems   ENT: Negative forNeck pain    Musculoskeletal: Negative for back pain. Skin: Negative for rash. Neurological: Negative for headaches or focal weakness   ____________________________________________   PHYSICAL EXAM:  VITAL SIGNS: ED Triage Vitals  Enc Vitals Group     BP 03/29/16 1745 131/73 mmHg     Pulse Rate 03/29/16 1745 70     Resp 03/29/16 1745 16     Temp 03/29/16 1745 98.6 F (37 C)     Temp Source 03/29/16 1745 Oral     SpO2 03/29/16 1745 100 %     Weight 03/29/16 1745 140 lb (63.504 kg)     Height 03/29/16 1745 5\' 8"  (1.727 m)     Head Cir --      Peak Flow --      Pain Score 03/29/16 1718 10     Pain Loc --  Pain Edu? --      Excl. in GC? --     Constitutional: Alert and oriented. Well appearing and in no distress. Eyes: Conjunctivae are normal.  ENT   Head: Normocephalic and atraumatic.   Mouth/Throat: Mucous membranes are moist. Cardiovascular: Normal rate, regular rhythm. Normal pulses in the upper extremities Respiratory: Normal respiratory effort without tachypnea nor retractions.   Musculoskeletal: Significant discomfort and attempting to abduct the left arm above 90, no pain with passive internal or external rotation. 2+ radial and ulnar pulses Neurologic:  Normal speech and language. No gross focal neurologic deficits are appreciated. Skin:  Skin is warm, dry and intact. No rash noted. Psychiatric: Mood and affect are normal. Patient exhibits appropriate insight and judgment.  ____________________________________________    LABS (pertinent positives/negatives)  Labs Reviewed - No data to display  ____________________________________________     ____________________________________________    RADIOLOGY I have personally reviewed any  xrays that were ordered on this patient: X-ray of the shoulder is unremarkable  ____________________________________________   PROCEDURES  Procedure(s) performed: none   ____________________________________________   INITIAL IMPRESSION / ASSESSMENT AND PLAN / ED COURSE  Pertinent labs & imaging results that were available during my care of the patient were reviewed by me and considered in my medical decision making (see chart for details).  Exam is suspicious for rotator cuff injury, recommend outpatient follow-up with MRI. We will place the patient's arm in a sling for comfort.  ____________________________________________   FINAL CLINICAL IMPRESSION(S) / ED DIAGNOSES  Final diagnoses:  Shoulder sprain, left, initial encounter     Jene Everyobert Hattye Siegfried, MD 03/29/16 2238

## 2016-03-29 NOTE — Discharge Instructions (Signed)
Shoulder Sprain °A shoulder sprain is a partial or complete tear in one of the tough, fiber-like tissues (ligaments) in the shoulder. The ligaments in the shoulder help to hold the shoulder in place. °CAUSES °This condition may be caused by: °· A fall. °· A hit to the shoulder. °· A twist of the arm. °RISK FACTORS °This condition is more likely to develop in: °· People who play sports. °· People who have problems with balance or coordination. °SYMPTOMS °Symptoms of this condition include: °· Pain when moving the shoulder. °· Limited ability to move the shoulder. °· Swelling and tenderness on top of the shoulder. °· Warmth in the shoulder. °· A change in the shape of the shoulder. °· Redness or bruising on the shoulder. °DIAGNOSIS °This condition is diagnosed with a physical exam. During the exam, you may be asked to do simple exercises with your shoulder. You may also have imaging tests, such as X-rays, MRI, or a CT scan. These tests can show how severe the sprain is. °TREATMENT °This condition may be treated with: °· Rest. °· Pain medicine. °· Ice. °· A sling or brace. This is used to keep the arm still while the shoulder is healing. °· Physical therapy or rehabilitation exercises. These help to improve the range of motion and strength of the shoulder. °· Surgery (rare). Surgery may be needed if the sprain caused a joint to become unstable. Surgery may also be needed to reduce pain. °Some people may develop ongoing shoulder pain or lose some range of motion in the shoulder. However, most people do not develop long-term problems. °HOME CARE INSTRUCTIONS °· Rest. °· Take over-the-counter and prescription medicines only as told by your health care provider. °· If directed, apply ice to the area: °¨ Put ice in a plastic bag. °¨ Place a towel between your skin and the bag. °¨ Leave the ice on for 20 minutes, 2-3 times per day. °· If you were given a shoulder sling or brace: °¨ Wear it as told. °¨ Remove it to shower or  bathe. °¨ Move your arm only as much as told by your health care provider, but keep your hand moving to prevent swelling. °· If you were shown how to do any exercises, do them as told by your health care provider. °· Keep all follow-up visits as told by your health care provider. This is important. °SEEK MEDICAL CARE IF: °· Your pain gets worse. °· Your pain is not relieved with medicines. °· You have increased redness or swelling. °SEEK IMMEDIATE MEDICAL CARE IF: °· You have a fever. °· You cannot move your arm or shoulder. °· You develop numbness or tingling in your arms, hands, or fingers. °  °This information is not intended to replace advice given to you by your health care provider. Make sure you discuss any questions you have with your health care provider. °  °Document Released: 01/30/2009 Document Revised: 06/04/2015 Document Reviewed: 01/06/2015 °Elsevier Interactive Patient Education ©2016 [employer] ° °

## 2017-08-17 ENCOUNTER — Ambulatory Visit: Payer: Self-pay | Admitting: Ophthalmology

## 2017-08-31 ENCOUNTER — Ambulatory Visit: Payer: Self-pay | Admitting: Ophthalmology

## 2017-09-15 ENCOUNTER — Ambulatory Visit: Payer: Self-pay | Admitting: Ophthalmology

## 2018-01-19 ENCOUNTER — Ambulatory Visit: Payer: Self-pay | Admitting: Family Medicine

## 2018-01-19 VITALS — Temp 98.5°F | Wt 136.5 lb

## 2018-01-19 DIAGNOSIS — G8929 Other chronic pain: Secondary | ICD-10-CM

## 2018-01-19 DIAGNOSIS — M25512 Pain in left shoulder: Principal | ICD-10-CM

## 2018-01-19 DIAGNOSIS — G894 Chronic pain syndrome: Secondary | ICD-10-CM | POA: Insufficient documentation

## 2018-01-19 MED ORDER — CYCLOBENZAPRINE HCL 10 MG PO TABS
10.0000 mg | ORAL_TABLET | Freq: Every day | ORAL | 2 refills | Status: DC
Start: 2018-01-19 — End: 2019-01-15

## 2018-01-19 NOTE — Progress Notes (Signed)
Temp 98.5 F (36.9 C)   Wt 136 lb 8 oz (61.9 kg)   BMI 20.75 kg/m    Subjective:    Patient ID: Julia Foley, female    DOB: 1985-11-09, 32 y.o.   MRN: [id number]  HPI: Julia Foley is a 32 y.o. female  Chief Complaint  Patient presents with  . Shoulder Pain    left shoulder (2)   SHOULDER PAIN- has been having problems with her shoulder for about 5 years. When it hurts it will have a pain come along the collar bone down into her arm and into her shoulder blade. Got kicked by a horse about 5 years ago. Has had x-rays which never really showed anything. Last x-ray about 2 years. No PT in the past.  Duration: chronic Involved shoulder: left Mechanism of injury: kicked by a horse Location: diffuse Onset:sudden Severity: mild to severe  Quality:  sharp Frequency: constant Radiation: yes Aggravating factors: moving it and using it and lifting   Alleviating factors: nothing  Status: worse Treatments attempted: flexeril, rest, ice, heat, ibuprofen and aleve  Relief with NSAIDs?:  no Weakness: yes Numbness: yes Decreased grip strength: yes Redness: no Swelling: no Bruising: no Fevers: no  Active Ambulatory Problems    Diagnosis Date Noted  . Chronic left shoulder pain 01/19/2018   Resolved Ambulatory Problems    Diagnosis Date Noted  . No Resolved Ambulatory Problems   Past Medical History:  Diagnosis Date  . Depression   . Panic attack   . Sickle cell trait [facility](HCC)    Past Surgical History:  Procedure Laterality Date  . CESAREAN SECTION     Outpatient Encounter Medications as of 01/19/2018  Medication Sig  . atomoxetine (STRATTERA) 25 MG capsule Take 75 mg by mouth daily.  . cyclobenzaprine (FLEXERIL) 10 MG tablet Take 1 tablet (10 mg total) by mouth at bedtime.  Marland Kitchen. HYDROcodone-acetaminophen (NORCO/VICODIN) 5-325 MG per tablet Take 2 tablets by mouth every 4 (four) hours as needed.  Marland Kitchen. ibuprofen (ADVIL,MOTRIN) 600 MG tablet Take 1 tablet (600 mg total) by mouth  every 6 (six) hours as needed for moderate pain. (Patient not taking: Reported on 04/14/2015)  . naproxen (NAPROSYN) 500 MG tablet Take 1 tablet (500 mg total) by mouth 2 (two) times daily with a meal.  . sertraline (ZOLOFT) 50 MG tablet Take 50 mg by mouth daily.  . traZODone (DESYREL) 50 MG tablet Take 50 mg by mouth at bedtime.  . [DISCONTINUED] cyclobenzaprine (FLEXERIL) 10 MG tablet Take 1 tablet (10 mg total) by mouth 2 (two) times daily as needed for muscle spasms. (Patient not taking: Reported on 04/14/2015)   No facility-administered encounter medications on file as of 01/19/2018.    No Known Allergies  Family History  Problem Relation Age of Onset  . Diabetes Paternal Grandmother     Social History   Socioeconomic History  . Marital status: Single    Spouse name: Not on file  . Number of children: Not on file  . Years of education: Not on file  . Highest education level: Not on file  Occupational History  . Not on file  Social Needs  . Financial resource strain: Not on file  . Food insecurity:    Worry: Not on file    Inability: Not on file  . Transportation needs:    Medical: Not on file    Non-medical: Not on file  Tobacco Use  . Smoking status: Current Every Day Smoker  Packs/day: 0.50    Years: 10.00    Pack years: 5.00  . Smokeless tobacco: Never Used  . Tobacco comment: has tried patches and gum  Substance and Sexual Activity  . Alcohol use: Yes  . Drug use: Not on file  . Sexual activity: Not on file  Lifestyle  . Physical activity:    Days per week: Not on file    Minutes per session: Not on file  . Stress: Not on file  Relationships  . Social connections:    Talks on phone: Not on file    Gets together: Not on file    Attends religious service: Not on file    Active member of club or organization: Not on file    Attends meetings of clubs or organizations: Not on file    Relationship status: Not on file  . Intimate partner violence:    Fear  of current or ex partner: Not on file    Emotionally abused: Not on file    Physically abused: Not on file    Forced sexual activity: Not on file  Other Topics Concern  . Not on file  Social History Narrative  . Not on file   Review of Systems  Constitutional: Negative.   Respiratory: Negative.   Cardiovascular: Negative.   Musculoskeletal: Positive for arthralgias and myalgias. Negative for back pain, gait problem, joint swelling, neck pain and neck stiffness.  Skin: Negative.   Neurological: Positive for weakness and numbness. Negative for dizziness, tremors, seizures, syncope, facial asymmetry, speech difficulty, light-headedness and headaches.  Psychiatric/Behavioral: Negative.     Per HPI unless specifically indicated above     Objective:    Temp 98.5 F (36.9 C)   Wt 136 lb 8 oz (61.9 kg)   BMI 20.75 kg/m   Wt Readings from Last 3 Encounters:  01/19/18 136 lb 8 oz (61.9 kg)  03/29/16 140 lb (63.5 kg)  04/07/15 180 lb (81.6 kg)    Physical Exam  Constitutional: She is oriented to person, place, and time. She appears well-developed and well-nourished. No distress.  HENT:  Head: Normocephalic and atraumatic.  Right Ear: Hearing normal.  Left Ear: Hearing normal.  Nose: Nose normal.  Eyes: Conjunctivae and lids are normal. Right eye exhibits no discharge. Left eye exhibits no discharge. No scleral icterus.  Cardiovascular: Normal rate, regular rhythm, normal heart sounds and intact distal pulses. Exam reveals no gallop and no friction rub.  No murmur heard. Pulmonary/Chest: Effort normal and breath sounds normal. No stridor. No respiratory distress. She has no wheezes. She has no rales. She exhibits no tenderness.  Neurological: She is alert and oriented to person, place, and time.  Skin: Skin is warm, dry and intact. Capillary refill takes less than 2 seconds. No rash noted. She is not diaphoretic. No erythema. No pallor.  Psychiatric: She has a normal mood and  affect. Her speech is normal and behavior is normal. Judgment and thought content normal. Cognition and memory are normal.     Shoulder: left    Inspection:  no swelling, ecchymosis, erythema or step off deformity.     Tenderness to Palpation:    Acromion: no    AC joint:yes    Clavicle: yes    Bicipital groove: no    Scapular spine: yes    Coracoid process: no    Humeral head: no    Supraspinatus tendon: yes     Range of Motion:     Abduction:Decreased  Adduction: Decreased    Flexion: Decreased    Extension: Decreased    Internal rotation: Decreased    External rotation: Decreased    Painful arc: yes     Muscle Strength:     Flexion: Decreased8    Extension: Decreased    Abduction: Decreased    Adduction: Normal    External rotation: Decreased    Internal rotation: Normal     Neuro: Sensation WNL. and Upper extremity reflexes WNL.     Special Tests:     Neer sign: Positive    Hawkins sign: Positive    Cross arm adduction: Positive    Yergason sign: Negative    O'brien sign: Negative     Speed sign: Positive   Results for orders placed or performed during the hospital encounter of 05/11/14  CBC  Result Value Ref Range   WBC 11.4 (H) 4.0 - 10.5 K/uL   RBC 4.43 3.87 - 5.11 MIL/uL   Hemoglobin 12.8 12.0 - 15.0 g/dL   HCT 16.1 (L) 09.6 - 04.5 %   MCV 81.0 78.0 - 100.0 fL   MCH 28.9 26.0 - 34.0 pg   MCHC 35.7 30.0 - 36.0 g/dL   RDW 40.9 81.1 - 91.4 %   Platelets 230 150 - 400 K/uL  I-Stat Chem 8, ED  Result Value Ref Range   Sodium 140 137 - 147 mEq/L   Potassium 4.0 3.7 - 5.3 mEq/L   Chloride 105 96 - 112 mEq/L   BUN 10 6 - 23 mg/dL   Creatinine, Ser 7.82 0.50 - 1.10 mg/dL   Glucose, Bld 97 70 - 99 mg/dL   Calcium, Ion 9.56 2.13 - 1.23 mmol/L   TCO2 26 0 - 100 mmol/L   Hemoglobin 13.9 12.0 - 15.0 g/dL   HCT 08.6 57.8 - 46.9 %  POC Urine Pregnancy, ED (do NOT order at [facility])  Result Value Ref Range   Preg Test, Ur NEGATIVE NEGATIVE      Assessment &  Plan:   Problem List Items Addressed This Visit      Other   Chronic left shoulder pain - Primary    Kicked by a horse about 5 years ago. X-rays show no fracture, getting worse. Will get her into ortho for further evaluation. Significant concern for tear. Will start flexeril to help with spasm and get her into PT to see if that helps until she sees ortho. Referrals generated today. Follow up in about 1 month.       Relevant Medications   cyclobenzaprine (FLEXERIL) 10 MG tablet   Other Relevant Orders   Ambulatory referral to Orthopedic Surgery       Follow up plan: Return in about 1 month (around 02/16/2018) for follow up shoulder pain.

## 2018-01-19 NOTE — Patient Instructions (Signed)
Shoulder Exercises Ask your health care provider which exercises are safe for you. Do exercises exactly as told by your health care provider and adjust them as directed. It is normal to feel mild stretching, pulling, tightness, or discomfort as you do these exercises, but you should stop right away if you feel sudden pain or your pain gets worse.Do not begin these exercises until told by your health care provider. RANGE OF MOTION EXERCISES These exercises warm up your muscles and joints and improve the movement and flexibility of your shoulder. These exercises also help to relieve pain, numbness, and tingling. These exercises involve stretching your injured shoulder directly. Exercise A: Pendulum  1. Stand near a wall or a surface that you can hold onto for balance. 2. Bend at the waist and let your left / right arm hang straight down. Use your other arm to support you. Keep your back straight and do not lock your knees. 3. Relax your left / right arm and shoulder muscles, and move your hips and your trunk so your left / right arm swings freely. Your arm should swing because of the motion of your body, not because you are using your arm or shoulder muscles. 4. Keep moving your body so your arm swings in the following directions, as told by your health care provider: ? Side to side. ? Forward and backward. ? In clockwise and counterclockwise circles. 5. Continue each motion for __________ seconds, or for as long as told by your health care provider. 6. Slowly return to the starting position. Repeat __________ times. Complete this exercise __________ times a day. Exercise B:Flexion, Standing  1. Stand and hold a broomstick, a cane, or a similar object. Place your hands a little more than shoulder-width apart on the object. Your left / right hand should be palm-up, and your other hand should be palm-down. 2. Keep your elbow straight and keep your shoulder muscles relaxed. Push the stick down with  your healthy arm to raise your left / right arm in front of your body, and then over your head until you feel a stretch in your shoulder. ? Avoid shrugging your shoulder while you raise your arm. Keep your shoulder blade tucked down toward the middle of your back. 3. Hold for __________ seconds. 4. Slowly return to the starting position. Repeat __________ times. Complete this exercise __________ times a day. Exercise C: Abduction, Standing 1. Stand and hold a broomstick, a cane, or a similar object. Place your hands a little more than shoulder-width apart on the object. Your left / right hand should be palm-up, and your other hand should be palm-down. 2. While keeping your elbow straight and your shoulder muscles relaxed, push the stick across your body toward your left / right side. Raise your left / right arm to the side of your body and then over your head until you feel a stretch in your shoulder. ? Do not raise your arm above shoulder height, unless your health care provider tells you to do that. ? Avoid shrugging your shoulder while you raise your arm. Keep your shoulder blade tucked down toward the middle of your back. 3. Hold for __________ seconds. 4. Slowly return to the starting position. Repeat __________ times. Complete this exercise __________ times a day. Exercise D:Internal Rotation  1. Place your left / right hand behind your back, palm-up. 2. Use your other hand to dangle an exercise band, a towel, or a similar object over your shoulder. Grasp the band with   your left / right hand so you are holding onto both ends. 3. Gently pull up on the band until you feel a stretch in the front of your left / right shoulder. ? Avoid shrugging your shoulder while you raise your arm. Keep your shoulder blade tucked down toward the middle of your back. 4. Hold for __________ seconds. 5. Release the stretch by letting go of the band and lowering your hands. Repeat __________ times. Complete  this exercise __________ times a day. STRETCHING EXERCISES These exercises warm up your muscles and joints and improve the movement and flexibility of your shoulder. These exercises also help to relieve pain, numbness, and tingling. These exercises are done using your healthy shoulder to help stretch the muscles of your injured shoulder. Exercise E: Corner Stretch (External Rotation and Abduction)  1. Stand in a doorway with one of your feet slightly in front of the other. This is called a staggered stance. If you cannot reach your forearms to the door frame, stand facing a corner of a room. 2. Choose one of the following positions as told by your health care provider: ? Place your hands and forearms on the door frame above your head. ? Place your hands and forearms on the door frame at the height of your head. ? Place your hands on the door frame at the height of your elbows. 3. Slowly move your weight onto your front foot until you feel a stretch across your chest and in the front of your shoulders. Keep your head and chest upright and keep your abdominal muscles tight. 4. Hold for __________ seconds. 5. To release the stretch, shift your weight to your back foot. Repeat __________ times. Complete this stretch __________ times a day. Exercise F:Extension, Standing 1. Stand and hold a broomstick, a cane, or a similar object behind your back. ? Your hands should be a little wider than shoulder-width apart. ? Your palms should face away from your back. 2. Keeping your elbows straight and keeping your shoulder muscles relaxed, move the stick away from your body until you feel a stretch in your shoulder. ? Avoid shrugging your shoulders while you move the stick. Keep your shoulder blade tucked down toward the middle of your back. 3. Hold for __________ seconds. 4. Slowly return to the starting position. Repeat __________ times. Complete this exercise __________ times a day. STRENGTHENING  EXERCISES These exercises build strength and endurance in your shoulder. Endurance is the ability to use your muscles for a long time, even after they get tired. Exercise G:External Rotation  1. Sit in a stable chair without armrests. 2. Secure an exercise band at elbow height on your left / right side. 3. Place a soft object, such as a folded towel or a small pillow, between your left / right upper arm and your body to move your elbow a few inches away (about 10 cm) from your side. 4. Hold the end of the band so it is tight and there is no slack. 5. Keeping your elbow pressed against the soft object, move your left / right forearm out, away from your abdomen. Keep your body steady so only your forearm moves. 6. Hold for __________ seconds. 7. Slowly return to the starting position. Repeat __________ times. Complete this exercise __________ times a day. Exercise H:Shoulder Abduction  1. Sit in a stable chair without armrests, or stand. 2. Hold a __________ weight in your left / right hand, or hold an exercise band with both hands.   3. Start with your arms straight down and your left / right palm facing in, toward your body. 4. Slowly lift your left / right hand out to your side. Do not lift your hand above shoulder height unless your health care provider tells you that this is safe. ? Keep your arms straight. ? Avoid shrugging your shoulder while you do this movement. Keep your shoulder blade tucked down toward the middle of your back. 5. Hold for __________ seconds. 6. Slowly lower your arm, and return to the starting position. Repeat __________ times. Complete this exercise __________ times a day. Exercise I:Shoulder Extension 1. Sit in a stable chair without armrests, or stand. 2. Secure an exercise band to a stable object in front of you where it is at shoulder height. 3. Hold one end of the exercise band in each hand. Your palms should face each other. 4. Straighten your elbows and  lift your hands up to shoulder height. 5. Step back, away from the secured end of the exercise band, until the band is tight and there is no slack. 6. Squeeze your shoulder blades together as you pull your hands down to the sides of your thighs. Stop when your hands are straight down by your sides. Do not let your hands go behind your body. 7. Hold for __________ seconds. 8. Slowly return to the starting position. Repeat __________ times. Complete this exercise __________ times a day. Exercise J:Standing Shoulder Row 1. Sit in a stable chair without armrests, or stand. 2. Secure an exercise band to a stable object in front of you so it is at waist height. 3. Hold one end of the exercise band in each hand. Your palms should be in a thumbs-up position. 4. Bend each of your elbows to an "L" shape (about 90 degrees) and keep your upper arms at your sides. 5. Step back until the band is tight and there is no slack. 6. Slowly pull your elbows back behind you. 7. Hold for __________ seconds. 8. Slowly return to the starting position. Repeat __________ times. Complete this exercise __________ times a day. Exercise K:Shoulder Press-Ups  1. Sit in a stable chair that has armrests. Sit upright, with your feet flat on the floor. 2. Put your hands on the armrests so your elbows are bent and your fingers are pointing forward. Your hands should be about even with the sides of your body. 3. Push down on the armrests and use your arms to lift yourself off of the chair. Straighten your elbows and lift yourself up as much as you comfortably can. ? Move your shoulder blades down, and avoid letting your shoulders move up toward your ears. ? Keep your feet on the ground. As you get stronger, your feet should support less of your body weight as you lift yourself up. 4. Hold for __________ seconds. 5. Slowly lower yourself back into the chair. Repeat __________ times. Complete this exercise __________ times a  day. Exercise L: Wall Push-Ups  1. Stand so you are facing a stable wall. Your feet should be about one arm-length away from the wall. 2. Lean forward and place your palms on the wall at shoulder height. 3. Keep your feet flat on the floor as you bend your elbows and lean forward toward the wall. 4. Hold for __________ seconds. 5. Straighten your elbows to push yourself back to the starting position. Repeat __________ times. Complete this exercise __________ times a day. This information is not intended to replace advice   given to you by your health care provider. Make sure you discuss any questions you have with your health care provider. Document Released: 07/28/2005 Document Revised: 06/07/2016 Document Reviewed: 05/25/2015 [employer] Interactive Patient Education  2018 [employer]  

## 2018-01-19 NOTE — Assessment & Plan Note (Signed)
Kicked by a horse about 5 years ago. X-rays show no fracture, getting worse. Will get her into ortho for further evaluation. Significant concern for tear. Will start flexeril to help with spasm and get her into PT to see if that helps until she sees ortho. Referrals generated today. Follow up in about 1 month.

## 2018-01-20 ENCOUNTER — Telehealth: Payer: Self-pay

## 2018-01-20 NOTE — Telephone Encounter (Signed)
Faxed referral to [facility]

## 2018-02-21 ENCOUNTER — Ambulatory Visit: Payer: Self-pay | Admitting: Family Medicine

## 2018-02-21 VITALS — BP 115/78 | HR 77 | Temp 98.6°F | Wt 138.5 lb

## 2018-02-21 DIAGNOSIS — M25512 Pain in left shoulder: Principal | ICD-10-CM

## 2018-02-21 DIAGNOSIS — G8929 Other chronic pain: Secondary | ICD-10-CM

## 2018-02-21 MED ORDER — MELOXICAM 15 MG PO TABS: 15.0000 mg | ORAL_TABLET | Freq: Every day | ORAL | 1 refills | Status: DC

## 2018-02-21 NOTE — Progress Notes (Signed)
     Primary Care Progress Note  Patient: Julia Foley Female    DOB: December 08, 1985   32 y.o.   MRN: [id number] Visit Date: 02/21/2018  Today's Provider: Mila Merry, MD  Chief Complaint  Patient presents with  . Follow-up    L shoulder pain   Subjective:    HPI  Follow up left shoulder pain. Flexeril hasn't helped.   Reports was kicked in shoulder by horse years ago. Was referred to orthopedics at last visit in April, but referral apparently not completed.      No Known Allergies  Current Outpatient Medications:  .  cyclobenzaprine (FLEXERIL) 10 MG tablet, Take 1 tablet (10 mg total) by mouth at bedtime., Disp: 20 tablet, Rfl: 2 .  atomoxetine (STRATTERA) 25 MG capsule, Take 75 mg by mouth daily., Disp: , Rfl:  .  HYDROcodone-acetaminophen (NORCO/VICODIN) 5-325 MG per tablet, Take 2 tablets by mouth every 4 (four) hours as needed. (Patient not taking: Reported on 02/21/2018), Disp: 16 tablet, Rfl: 0 .  ibuprofen (ADVIL,MOTRIN) 600 MG tablet, Take 1 tablet (600 mg total) by mouth every 6 (six) hours as needed for moderate pain. (Patient not taking: Reported on 04/14/2015), Disp: 30 tablet, Rfl: 0 .  naproxen (NAPROSYN) 500 MG tablet, Take 1 tablet (500 mg total) by mouth 2 (two) times daily with a meal. (Patient not taking: Reported on 02/21/2018), Disp: 20 tablet, Rfl: 2 .  sertraline (ZOLOFT) 50 MG tablet, Take 50 mg by mouth daily., Disp: , Rfl:  .  traZODone (DESYREL) 50 MG tablet, Take 50 mg by mouth at bedtime., Disp: , Rfl:   Review of Systems  Social History   Tobacco Use  . Smoking status: Current Every Day Smoker    Packs/day: 0.50    Years: 10.00    Pack years: 5.00  . Smokeless tobacco: Never Used  . Tobacco comment: has tried patches and gum  Substance Use Topics  . Alcohol use: Yes   Objective:   BP 115/78   Pulse 77   Temp 98.6 F (37 C)   Wt 138 lb 8 oz (62.8 kg)   BMI 21.06 kg/m   Physical Exam  Tender left shoulder positive impingement. No gross  deformities.     Assessment & Plan:     1. Chronic left shoulder pain  - meloxicam (MOBIC) 15 MG tablet; Take 1 tablet (15 mg total) by mouth daily. Take with food  Dispense: 30 tablet; Refill: 1  Will check on status of orthopedic referral initiated at visit of 01/19/2018      Mila Merry, MD

## 2018-02-22 ENCOUNTER — Ambulatory Visit: Payer: Self-pay | Admitting: Specialist

## 2018-02-22 DIAGNOSIS — M25512 Pain in left shoulder: Secondary | ICD-10-CM

## 2018-02-22 NOTE — Progress Notes (Signed)
   Subjective:    Patient ID: Julia Foley, female    DOB: March 13, 1986, 32 y.o.   MRN: [id number]  HPI Mrs. Wassenaar is not currently working. Her tenderness is mainly over the clavicle but then radiates globally. She is not able to sleep on left side. She hooks her bra in the front and twists it. She cannot scratch her back with her left arm like she use too. She is able to do her ADL's as long as she does not do them for too long.    Review of Systems     Objective:   Physical Exam  Rt shoulder FROM. Lt shoulder 160 degrees FF. 80 degrees EXT. ADD 30 degrees. ABD 100 degrees. IR/ER 90/90. Hawkin's test neg. Neer's test pos at 90 degrees. O'Brien's test neg. Yergason's test neg. Speed's test caused posterior pain. Rotator cuff strength 5/5 but, resisted ABD caused posterior arm pain.       Assessment & Plan:  Plan: MRI of left shoulder. RTC after MRI.

## 2018-03-09 ENCOUNTER — Ambulatory Visit: Payer: Medicaid Other

## 2018-03-15 ENCOUNTER — Ambulatory Visit: Payer: Medicaid Other | Admitting: Specialist

## 2018-03-15 DIAGNOSIS — R69 Illness, unspecified: Secondary | ICD-10-CM

## 2018-03-15 NOTE — Progress Notes (Signed)
   Subjective:    Patient ID: Julia ShellingCassia Foley, female    DOB: 1986-03-03, 32 y.o.   MRN: [id number]  HPI Unchanged. She has not been able to obtain MRI as of yet and has been approved for medicaid.       Review of Systems     Objective:   Physical Exam  deferred      Assessment & Plan:  RTC PRN

## 2018-05-23 ENCOUNTER — Other Ambulatory Visit: Payer: Self-pay | Admitting: Orthopedic Surgery

## 2018-05-23 DIAGNOSIS — M7542 Impingement syndrome of left shoulder: Secondary | ICD-10-CM

## 2018-06-07 ENCOUNTER — Ambulatory Visit
Admission: RE | Admit: 2018-06-07 | Discharge: 2018-06-07 | Disposition: A | Discharge status: Home / Self Care | Payer: Medicaid Other | Source: Ambulatory Visit | Attending: Orthopedic Surgery | Admitting: Orthopedic Surgery

## 2018-06-07 DIAGNOSIS — M7542 Impingement syndrome of left shoulder: Secondary | ICD-10-CM | POA: Diagnosis present

## 2018-06-07 DIAGNOSIS — M25412 Effusion, left shoulder: Secondary | ICD-10-CM | POA: Insufficient documentation

## 2018-12-27 ENCOUNTER — Ambulatory Visit: Payer: Medicaid Other | Admitting: Nurse Practitioner
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
## 2019-01-15 ENCOUNTER — Other Ambulatory Visit: Payer: Self-pay

## 2019-01-15 ENCOUNTER — Ambulatory Visit
Payer: Medicaid Other | Attending: Student in an Organized Health Care Education/Training Program | Admitting: Student in an Organized Health Care Education/Training Program

## 2019-01-15 ENCOUNTER — Telehealth: Payer: Self-pay

## 2019-01-15 DIAGNOSIS — M542 Cervicalgia: Secondary | ICD-10-CM

## 2019-01-15 DIAGNOSIS — M5412 Radiculopathy, cervical region: Secondary | ICD-10-CM

## 2019-01-15 DIAGNOSIS — G8929 Other chronic pain: Secondary | ICD-10-CM

## 2019-01-15 DIAGNOSIS — W5512XA Struck by horse, initial encounter: Secondary | ICD-10-CM | POA: Insufficient documentation

## 2019-01-15 DIAGNOSIS — M25512 Pain in left shoulder: Secondary | ICD-10-CM | POA: Diagnosis not present

## 2019-01-15 DIAGNOSIS — W5512XS Struck by horse, sequela: Secondary | ICD-10-CM | POA: Diagnosis not present

## 2019-01-15 DIAGNOSIS — G894 Chronic pain syndrome: Secondary | ICD-10-CM

## 2019-01-15 MED ORDER — DICLOFENAC SODIUM 75 MG PO TBEC: 75.0000 mg | DELAYED_RELEASE_TABLET | Freq: Two times a day (BID) | ORAL | 2 refills | Status: AC

## 2019-01-15 NOTE — Telephone Encounter (Signed)
Called to inform patient that her medication went to [facility]. Patient with understanding.

## 2019-11-05 ENCOUNTER — Other Ambulatory Visit: Payer: Self-pay | Admitting: Physician Assistant

## 2019-11-05 DIAGNOSIS — M79641 Pain in right hand: Secondary | ICD-10-CM

## 2021-07-20 ENCOUNTER — Emergency Department: Payer: Medicaid Other

## 2021-07-20 ENCOUNTER — Other Ambulatory Visit: Payer: Self-pay

## 2021-07-20 ENCOUNTER — Emergency Department
Admission: EM | Admit: 2021-07-20 | Discharge: 2021-07-20 | Disposition: A | Discharge status: Home / Self Care | Payer: Medicaid Other | Attending: Student in an Organized Health Care Education/Training Program | Admitting: Student in an Organized Health Care Education/Training Program

## 2021-07-20 DIAGNOSIS — F17210 Nicotine dependence, cigarettes, uncomplicated: Secondary | ICD-10-CM | POA: Diagnosis not present

## 2021-07-20 DIAGNOSIS — M25562 Pain in left knee: Secondary | ICD-10-CM | POA: Diagnosis not present

## 2021-07-20 NOTE — ED Provider Notes (Signed)
[facility] Emergency Department Provider Note    Event Date/Time   First MD Initiated Contact with Patient 07/20/21 1247     (approximate)  I have reviewed the triage vital signs and the nursing notes.   HISTORY  Chief Complaint Knee Pain    HPI Julia Foley is a 35 y.o. female below listed past medical history presents to the ER for evaluation of several weeks of left knee pain.  States she was walking and felt the popping sensation a few weeks ago.  States that she will intermittently feeling clicking sensation in her left knee she is able to put weight on it.  No numbness or tingling or fevers.  No other associated injury.  States she was talking to one of her friends who is a Land and told her to get x-rays.  Past Medical History:  Diagnosis Date   Depression    Panic attack    Sickle cell trait (HCC)    Family History  Problem Relation Age of Onset   Diabetes Paternal Grandmother    Past Surgical History:  Procedure Laterality Date   CESAREAN SECTION     Patient Active Problem List   Diagnosis Date Noted   Cervical radicular pain 01/15/2019   Struck by horse 01/15/2019   Chronic pain syndrome 01/19/2018      Prior to Admission medications   Medication Sig Start Date End Date Taking? Authorizing Provider  amitriptyline (ELAVIL) 10 MG tablet Take 10 mg by mouth at bedtime.    [provider]  atomoxetine (STRATTERA) 25 MG capsule Take 75 mg by mouth daily.    [provider]  diclofenac (VOLTAREN) 75 MG EC tablet Take 1 tablet (75 mg total) by mouth 2 (two) times daily. Discontinue and refrain from other NSAIDs while on diclofenac. 01/15/19   Edward Jolly, MD  sertraline (ZOLOFT) 50 MG tablet Take 50 mg by mouth daily.    [provider]  tiZANidine (ZANAFLEX) 2 MG tablet Take by mouth 3 (three) times daily.    [provider]  traZODone (DESYREL) 50 MG tablet Take 50 mg by mouth at bedtime.     [provider]    Allergies Patient has no known allergies.    Social History Social History   Tobacco Use   Smoking status: Every Day    Packs/day: 0.50    Years: 10.00    Pack years: 5.00    Types: Cigarettes   Smokeless tobacco: Never   Tobacco comments:    has tried patches and gum  Substance Use Topics   Alcohol use: Yes    Review of Systems Patient denies headaches, rhinorrhea, blurry vision, numbness, shortness of breath, chest pain, edema, cough, abdominal pain, nausea, vomiting, diarrhea, dysuria, fevers, rashes or hallucinations unless otherwise stated above in HPI. ____________________________________________   PHYSICAL EXAM:  VITAL SIGNS: Vitals:   07/20/21 1030  BP: 121/68  Pulse: (!) 54  Resp: 18  Temp: 98.4 F (36.9 C)  SpO2: 100%    Constitutional: Alert and oriented.  Eyes: Conjunctivae are normal.  Head: Atraumatic. Nose: No congestion/rhinnorhea. Mouth/Throat: Mucous membranes are moist.   Neck: No stridor. Painless ROM.  Cardiovascular: Normal rate, regular rhythm. Grossly normal heart sounds.  Good peripheral circulation. Respiratory: Normal respiratory effort.  No retractions. Lungs CTAB. Gastrointestinal: Soft and nontender. No distention. No abdominal bruits. No CVA tenderness. Genitourinary:  Musculoskeletal: No effusion, no deformity, no overlying erythema or rash.  Neurovascular intact.  No valgus  or varus instability.  Able to hold leg out and straight knee raise against gravity.  Neurologic:  Normal speech and language. No gross focal neurologic deficits are appreciated. No facial droop Skin:  Skin is warm, dry and intact. No rash noted. Psychiatric: Mood and affect are normal. Speech and behavior are normal.  ____________________________________________   LABS (all labs ordered are listed, but only abnormal results are displayed)  No results found for this or any previous visit (from the past 24  hour(s)). ____________________________________________ ____________________________________________  RADIOLOGY  I personally reviewed all radiographic images ordered to evaluate for the above acute complaints and reviewed radiology reports and findings.  These findings were personally discussed with the patient.  Please see medical record for radiology report.  ____________________________________________   PROCEDURES  Procedure(s) performed:  Procedures    Critical Care performed: no ____________________________________________   INITIAL IMPRESSION / ASSESSMENT AND PLAN / ED COURSE  Pertinent labs & imaging results that were available during my care of the patient were reviewed by me and considered in my medical decision making (see chart for details).   DDX: fracture, contusion, dislocation, meniscus tear  Julia Foley is a 35 y.o. who presents to the ED with symptoms as described above.  Patient with left knee pain for the past several weeks.  X-ray without fracture.  Neurovascular intact.  Suspect ligamentous or meniscus injury will give supportive care as well as referral to orthopedics.  Patient agreeable plan     The patient was evaluated in Emergency Department today for the symptoms described in the history of present illness. He/she was evaluated in the context of the global COVID-19 pandemic, which necessitated consideration that the patient might be at risk for infection with the SARS-CoV-2 virus that causes COVID-19. Institutional protocols and algorithms that pertain to the evaluation of patients at risk for COVID-19 are in a state of rapid change based on information released by regulatory bodies including the CDC and federal and state organizations. These policies and algorithms were followed during the patient's care in the ED.  As part of my medical decision making, I reviewed the following data within the electronic MEDICAL RECORD NUMBER Nursing notes reviewed and  incorporated, Labs reviewed, notes from prior ED visits and [place] Controlled Substance Database   ____________________________________________   FINAL CLINICAL IMPRESSION(S) / ED DIAGNOSES  Final diagnoses:  Acute pain of left knee      NEW MEDICATIONS STARTED DURING THIS VISIT:  New Prescriptions   No medications on file     Note:  This document was prepared using Dragon voice recognition software and may include unintentional dictation errors.    Willy Eddy, MD 07/20/21 [phone]

## 2021-07-20 NOTE — ED Triage Notes (Signed)
Pt comes with c/o knee pain. Pt states week ago she injured it while walking. Pt states she was walking on gravel and felt a pop.

## 2021-07-20 NOTE — ED Notes (Signed)
See triage note  presents with left knee pain  states she felt a pop and her knee gave out  no deformity noted

## 2021-08-11 ENCOUNTER — Other Ambulatory Visit (HOSPITAL_COMMUNITY): Payer: Self-pay | Admitting: Orthopedic Surgery

## 2021-08-11 ENCOUNTER — Other Ambulatory Visit: Payer: Self-pay | Admitting: Orthopedic Surgery

## 2021-08-11 DIAGNOSIS — M2392 Unspecified internal derangement of left knee: Secondary | ICD-10-CM

## 2021-08-24 ENCOUNTER — Ambulatory Visit
Admission: RE | Admit: 2021-08-24 | Discharge: 2021-08-24 | Disposition: A | Discharge status: Home / Self Care | Payer: Medicaid Other | Source: Ambulatory Visit | Attending: Orthopedic Surgery | Admitting: Orthopedic Surgery

## 2021-08-24 ENCOUNTER — Other Ambulatory Visit: Payer: Self-pay

## 2021-08-24 DIAGNOSIS — M2392 Unspecified internal derangement of left knee: Secondary | ICD-10-CM | POA: Diagnosis present

## 2023-02-19 IMAGING — MR MR KNEE*L* W/O CM
7 of 8 series · 26 of 40 positions shown · non-contrast
Comparison: X-ray knee 07/20/2021.

CLINICAL DATA: Left medial/lateral knee pain and notes knee popped
1 month ago while walking.

EXAM:
MRI OF THE LEFT KNEE WITHOUT CONTRAST
TECHNIQUE: Multiplanar, multisequence MR imaging of the knee was performed. No
intravenous contrast was administered.

[Series 8: T2 fat-sat · axial · left · 4.0mm · 0.50mm/px · z∈[-70,+45]mm · 4 of 24 slices shown (1 of 3)]
[im 1/24]
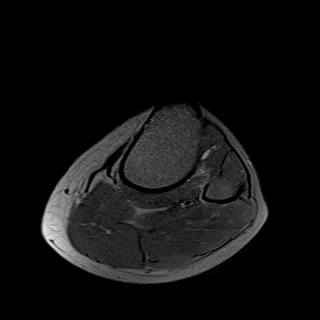
[im 8/24]
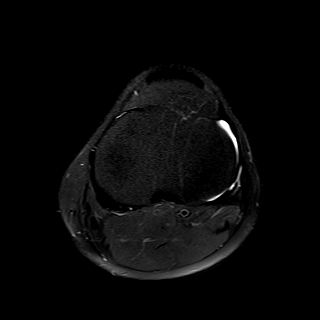
[im 16/24]
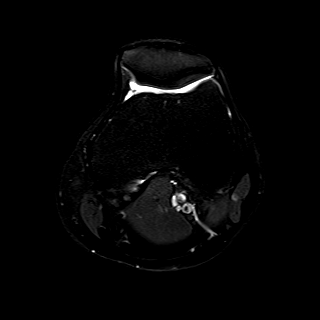
[im 24/24]
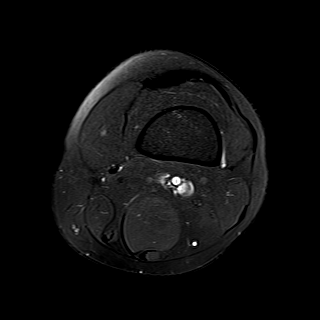

[Series 9: T1 · coronal · left · 4.0mm · 0.47mm/px · 4 of 22 slices shown]
[im 1/22]
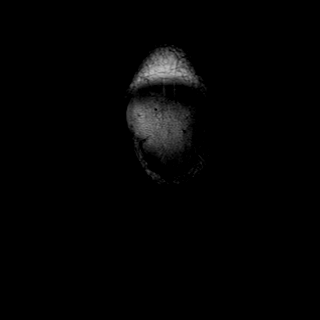
[im 8/22]
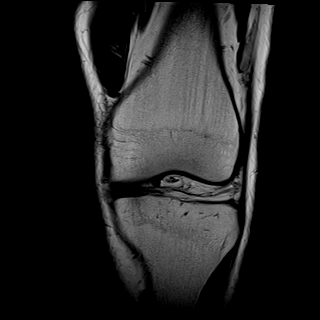
[im 15/22]
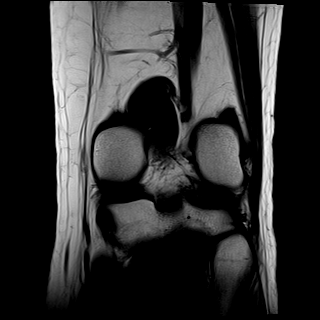
[im 22/22]
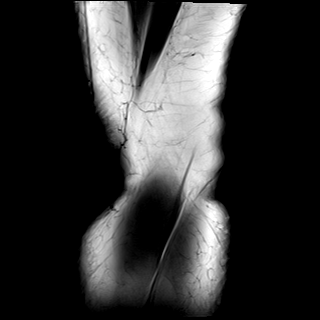

[Series 10: T2 fat-sat · coronal · left · 4.0mm · 0.47mm/px · 4 of 22 slices shown (2 of 3)]
[im 1/22]
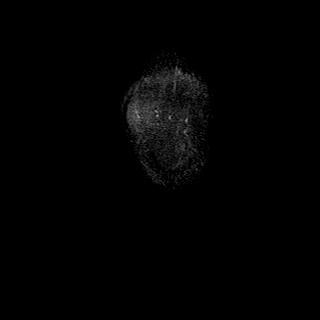
[im 8/22]
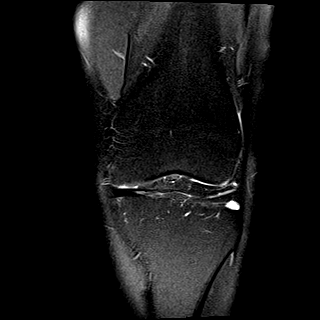
[im 15/22]
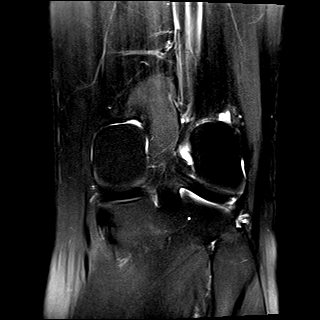
[im 22/22]
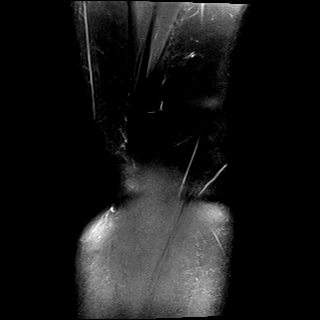

[Series 11: PD fat-sat · coronal · left · 4.0mm · 0.59mm/px · 4 of 22 slices shown (1 of 2)]
[im 1/22]
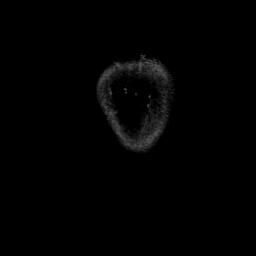
[im 8/22]
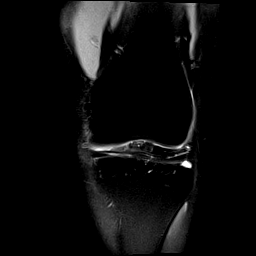
[im 15/22]
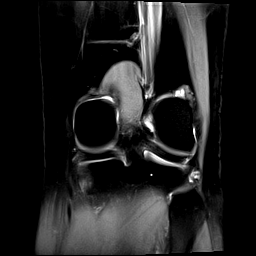
[im 22/22]
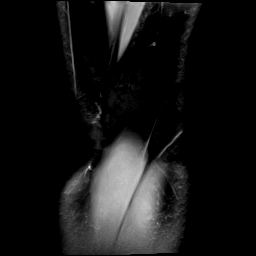

[Series 12: PD fat-sat · sagittal · left · 3.0mm · 0.47mm/px · 4 of 24 slices shown (2 of 2)]
[im 1/24]
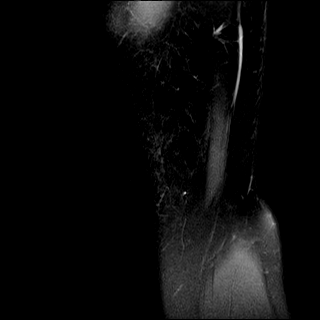
[im 8/24]
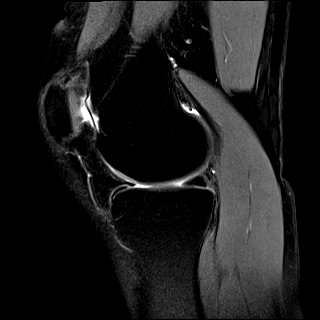
[im 16/24]
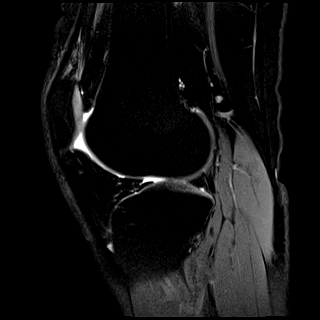
[im 24/24]
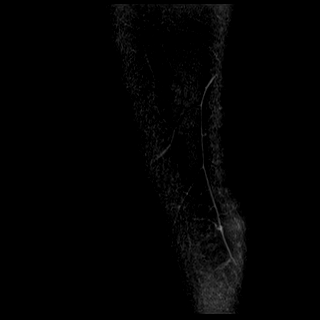

[Series 13: T2 fat-sat · sagittal · left · 3.0mm · 0.47mm/px · 4 of 24 slices shown (3 of 3)]
[im 1/24]
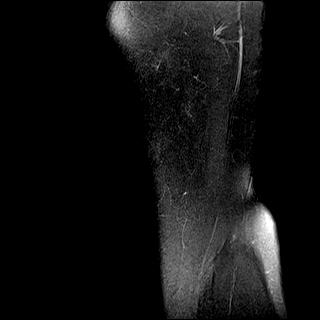
[im 8/24]
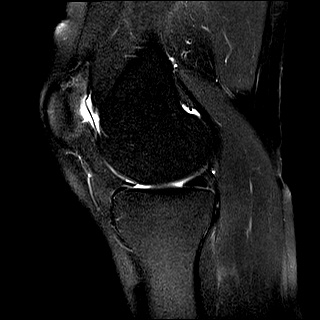
[im 16/24]
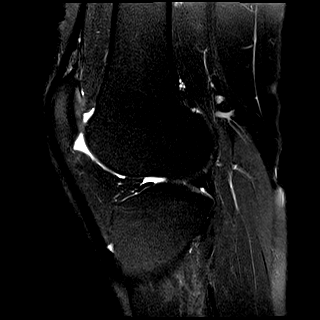
[im 24/24]
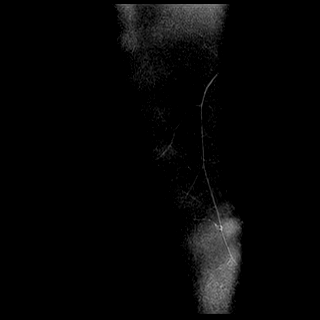

[Series 14: PD · oblique · left · 2.0mm · 0.47mm/px · 2 of 10 slices shown]
[im 1/10]
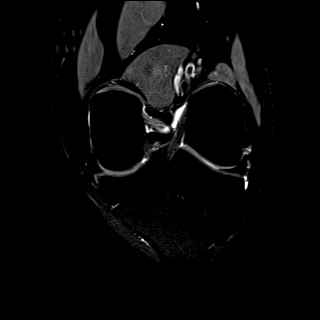
[im 10/10]
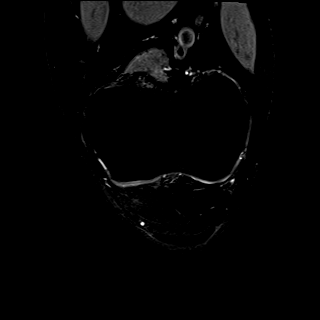

[26 of 40 positions shown; findings below may reference images not displayed]

FINDINGS: MENISCI

Medial meniscus:  Intact.

Lateral meniscus: Intact.

LIGAMENTS

Cruciates: Intact ACL and PCL.

Collaterals: Medial collateral ligament is intact. Lateral
collateral ligament complex is intact.

CARTILAGE

Patellofemoral:  No chondral defect.

Medial:  No chondral defect.

Lateral: No chondral defect.

Joint: No joint effusion. Normal Hoffa's fat. No plical thickening.

Popliteal Fossa: No Baker's cyst.  Intact popliteus tendon.

Extensor Mechanism: Intact quadriceps tendon. Minimal tendinosis of
the proximal patellar tendon. Intact medial patellar retinaculum.
Intact lateral patellar retinaculum. Intact MPFL.

Bones: No focal marrow signal abnormality. No fracture or
dislocation.

Other: None.
IMPRESSION: 1. No meniscal or ligamentous injury of the left knee.
2.  No acute osseous injury of the left knee.
3. Minimal tendinosis of the proximal patellar tendon.

## 2024-05-10 ENCOUNTER — Encounter: Payer: Self-pay | Admitting: Podiatry

## 2024-05-10 ENCOUNTER — Ambulatory Visit (INDEPENDENT_AMBULATORY_CARE_PROVIDER_SITE_OTHER): Admitting: Podiatry

## 2024-05-10 DIAGNOSIS — S93402S Sprain of unspecified ligament of left ankle, sequela: Secondary | ICD-10-CM

## 2024-05-10 DIAGNOSIS — M25572 Pain in left ankle and joints of left foot: Secondary | ICD-10-CM | POA: Diagnosis not present

## 2024-05-10 NOTE — Progress Notes (Unsigned)
 Subjective:  Patient ID: Julia Foley, female    DOB: 1986/06/09,  MRN: [id number]  Chief Complaint  Patient presents with   Foot Pain    Pt stated that she has been having some discomfort with her foot and it sends pains up to her knee     38 y.o. female presents with the above complaint.  Patient presents with complaint of left ankle pain on the lateral side.  She states that she has been dealing with this for like 6 months is not getting better.  She has a history of ankle sprain/injury.  She states that is causing her a lot of discomfort and signs of pain up to her knees she has not seen anyone as prior to seeing me denies any other acute complaints would like to discuss treatment options for this.  Pain scale 7 out of 10 dull aching nature   Review of Systems: Negative except as noted in the HPI. Denies N/V/F/Ch.  Past Medical History:  Diagnosis Date   Depression    Panic attack    Sickle cell trait (HCC)     Current Outpatient Medications:    amitriptyline (ELAVIL) 10 MG tablet, Take 10 mg by mouth at bedtime., Disp: , Rfl:    atomoxetine (STRATTERA) 25 MG capsule, Take 75 mg by mouth daily., Disp: , Rfl:    diclofenac  (VOLTAREN ) 75 MG EC tablet, Take 1 tablet (75 mg total) by mouth 2 (two) times daily. Discontinue and refrain from other NSAIDs while on diclofenac ., Disp: 60 tablet, Rfl: 2   sertraline (ZOLOFT) 50 MG tablet, Take 50 mg by mouth daily., Disp: , Rfl:    tiZANidine (ZANAFLEX) 2 MG tablet, Take by mouth 3 (three) times daily., Disp: , Rfl:    traZODone (DESYREL) 50 MG tablet, Take 50 mg by mouth at bedtime., Disp: , Rfl:   Social History   Tobacco Use  Smoking Status Every Day   Current packs/day: 0.50   Average packs/day: 0.5 packs/day for 10.0 years (5.0 ttl pk-yrs)   Types: Cigarettes  Smokeless Tobacco Never  Tobacco Comments   has tried patches and gum    No Known Allergies Objective:  There were no vitals filed for this visit. There is no height  or weight on file to calculate BMI. Constitutional Well developed. Well nourished.  Vascular Dorsalis pedis pulses palpable bilaterally. Posterior tibial pulses palpable bilaterally. Capillary refill normal to all digits.  No cyanosis or clubbing noted. Pedal hair growth normal.  Neurologic Normal speech. Oriented to person, place, and time. Epicritic sensation to light touch grossly present bilaterally.  Dermatologic Nails well groomed and normal in appearance. No open wounds. No skin lesions.  Orthopedic: Pain on palpation to the left ATFL ligament pain with plantarflexion eversion of the foot no pain with dorsiflexion eversion of the foot no pain at the peroneal tendon posterior tibial tendon Achilles tendon.  Negative anterior drawer test   Radiographs: None Assessment:   1. Moderate ankle sprain, left, sequela    Plan:  Patient was evaluated and treated and all questions answered.  Left ATFL ligament injury - All questions and concerns were discussed with the patient in extensive detail given the amount of injury that is present she would benefit from cam boot immobilization Cam boot was dispensed - I encouraged her to make shoe gear modification as well. - I will see her back in 4 weeks if there is no improvement we will discuss steroid injection  No follow-ups on file.

## 2024-06-07 ENCOUNTER — Ambulatory Visit (INDEPENDENT_AMBULATORY_CARE_PROVIDER_SITE_OTHER): Admitting: Podiatry

## 2024-06-07 DIAGNOSIS — S93402S Sprain of unspecified ligament of left ankle, sequela: Secondary | ICD-10-CM | POA: Diagnosis not present

## 2024-06-07 DIAGNOSIS — M7751 Other enthesopathy of right foot: Secondary | ICD-10-CM | POA: Diagnosis not present

## 2024-06-07 MED ORDER — MELOXICAM 15 MG PO TABS: 15.0000 mg | ORAL_TABLET | Freq: Every day | ORAL | 0 refills | Status: DC

## 2024-06-07 MED ORDER — METHYLPREDNISOLONE 4 MG PO TBPK: ORAL_TABLET | ORAL | 0 refills | Status: AC

## 2024-06-07 NOTE — Progress Notes (Addendum)
  Subjective:  Patient ID: Julia Foley, female    DOB: 07/04/1986,  MRN: [id number]  Chief Complaint  Patient presents with   Foot Pain    follow up left foot. Some discomfort near the ankle bone    38 y.o. female presents with the above complaint.  Patient presents for follow-up of left ankle sprain.  She states has been about the same cam boot helped a little bit but there is still some discomfort.  Denies any other acute complaints would like to discuss other treatment options   Review of Systems: Negative except as noted in the HPI. Denies N/V/F/Ch.  Past Medical History:  Diagnosis Date   Depression    Panic attack    Sickle cell trait     Current Outpatient Medications:    methylPREDNISolone  (MEDROL  DOSEPAK) 4 MG TBPK tablet, Take as directed, Disp: 21 each, Rfl: 0   amitriptyline (ELAVIL) 10 MG tablet, Take 10 mg by mouth at bedtime., Disp: , Rfl:    atomoxetine (STRATTERA) 25 MG capsule, Take 75 mg by mouth daily., Disp: , Rfl:    diclofenac  (VOLTAREN ) 75 MG EC tablet, Take 1 tablet (75 mg total) by mouth 2 (two) times daily. Discontinue and refrain from other NSAIDs while on diclofenac ., Disp: 60 tablet, Rfl: 2   meloxicam  (MOBIC ) 15 MG tablet, Take 1 tablet by mouth once daily, Disp: 30 tablet, Rfl: 0   sertraline (ZOLOFT) 50 MG tablet, Take 50 mg by mouth daily., Disp: , Rfl:    tiZANidine (ZANAFLEX) 2 MG tablet, Take by mouth 3 (three) times daily., Disp: , Rfl:    traZODone (DESYREL) 50 MG tablet, Take 50 mg by mouth at bedtime., Disp: , Rfl:   Social History   Tobacco Use  Smoking Status Every Day   Current packs/day: 0.50   Average packs/day: 0.5 packs/day for 10.0 years (5.0 ttl pk-yrs)   Types: Cigarettes  Smokeless Tobacco Never  Tobacco Comments   has tried patches and gum    No Known Allergies Objective:  There were no vitals filed for this visit. There is no height or weight on file to calculate BMI. Constitutional Well developed. Well nourished.   Vascular Dorsalis pedis pulses palpable bilaterally. Posterior tibial pulses palpable bilaterally. Capillary refill normal to all digits.  No cyanosis or clubbing noted. Pedal hair growth normal.  Neurologic Normal speech. Oriented to person, place, and time. Epicritic sensation to light touch grossly present bilaterally.  Dermatologic Nails well groomed and normal in appearance. No open wounds. No skin lesions.  Orthopedic: Pain on palpation to the left ATFL ligament pain with plantarflexion eversion of the foot no pain with dorsiflexion eversion of the foot no pain at the peroneal tendon posterior tibial tendon Achilles tendon.  Negative anterior drawer test   Radiographs: None Assessment:   1. Moderate ankle sprain, left, sequela   2. Capsulitis of right ankle     Plan:  Patient was evaluated and treated and all questions answered.  Left ATFL ligament injury/ankle capsulitis - All questions and concerns were discussed with the patient in extensive detail clinically cam boot immobilization did not help much.  At this time patient will benefit from Medrol  Dosepak and meloxicam .  She does not want to do steroid injection.  If there is no improvement we will discuss it during next visit she states understanding. No follow-ups on file.

## 2024-07-02 ENCOUNTER — Other Ambulatory Visit: Payer: Self-pay | Admitting: Podiatry
